# Patient Record
Sex: Male | Born: 1955 | Race: White | Hispanic: No | Marital: Single | Smoking: Never smoker
Health system: Southern US, Community
[De-identification: ages and names within clinical notes are randomized; demographics above are authoritative.]

## PROBLEM LIST (undated history)

## (undated) DIAGNOSIS — J309 Allergic rhinitis, unspecified: Secondary | ICD-10-CM

## (undated) DIAGNOSIS — K5790 Diverticulosis of intestine, part unspecified, without perforation or abscess without bleeding: Secondary | ICD-10-CM

## (undated) DIAGNOSIS — S0990XA Unspecified injury of head, initial encounter: Secondary | ICD-10-CM

## (undated) DIAGNOSIS — E039 Hypothyroidism, unspecified: Secondary | ICD-10-CM

## (undated) DIAGNOSIS — E119 Type 2 diabetes mellitus without complications: Secondary | ICD-10-CM

## (undated) DIAGNOSIS — F329 Major depressive disorder, single episode, unspecified: Secondary | ICD-10-CM

## (undated) DIAGNOSIS — F32A Depression, unspecified: Secondary | ICD-10-CM

## (undated) DIAGNOSIS — E291 Testicular hypofunction: Secondary | ICD-10-CM

## (undated) DIAGNOSIS — K648 Other hemorrhoids: Secondary | ICD-10-CM

## (undated) HISTORY — PX: ORIF FINGER FRACTURE: SHX2122

## (undated) HISTORY — PX: SHOULDER SURGERY: SHX246

## (undated) HISTORY — DX: Allergic rhinitis, unspecified: J30.9

## (undated) HISTORY — DX: Major depressive disorder, single episode, unspecified: F32.9

## (undated) HISTORY — PX: HERNIA REPAIR: SHX51

## (undated) HISTORY — DX: Type 2 diabetes mellitus without complications: E11.9

## (undated) HISTORY — PX: CARPAL TUNNEL RELEASE: SHX101

## (undated) HISTORY — DX: Hypothyroidism, unspecified: E03.9

## (undated) HISTORY — DX: Other hemorrhoids: K64.8

## (undated) HISTORY — DX: Depression, unspecified: F32.A

## (undated) HISTORY — DX: Unspecified injury of head, initial encounter: S09.90XA

## (undated) HISTORY — DX: Testicular hypofunction: E29.1

## (undated) HISTORY — DX: Diverticulosis of intestine, part unspecified, without perforation or abscess without bleeding: K57.90

---
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
## 1998-10-07 ENCOUNTER — Emergency Department (HOSPITAL_COMMUNITY): Admission: EM | Admit: 1998-10-07 | Discharge: 1998-10-07 | Payer: Self-pay | Admitting: Emergency Medicine

## 1998-10-07 ENCOUNTER — Encounter: Payer: Self-pay | Admitting: Emergency Medicine

## 1998-10-07 ENCOUNTER — Inpatient Hospital Stay (HOSPITAL_COMMUNITY): Admission: AD | Admit: 1998-10-07 | Discharge: 1998-10-08 | Payer: Self-pay | Admitting: *Deleted

## 1999-03-26 HISTORY — PX: INGUINAL HERNIA REPAIR: SHX194

## 1999-05-03 ENCOUNTER — Encounter (INDEPENDENT_AMBULATORY_CARE_PROVIDER_SITE_OTHER): Payer: Self-pay | Admitting: *Deleted

## 1999-05-03 ENCOUNTER — Other Ambulatory Visit: Admission: RE | Admit: 1999-05-03 | Discharge: 1999-05-03 | Payer: Self-pay | Admitting: Surgery

## 2003-02-15 ENCOUNTER — Emergency Department (HOSPITAL_COMMUNITY): Admission: EM | Admit: 2003-02-15 | Discharge: 2003-02-15 | Payer: Self-pay | Admitting: Emergency Medicine

## 2003-02-15 ENCOUNTER — Ambulatory Visit (HOSPITAL_BASED_OUTPATIENT_CLINIC_OR_DEPARTMENT_OTHER): Admission: RE | Admit: 2003-02-15 | Discharge: 2003-02-15 | Payer: Self-pay | Admitting: Orthopedic Surgery

## 2004-04-16 ENCOUNTER — Emergency Department (HOSPITAL_COMMUNITY): Admission: AD | Admit: 2004-04-16 | Discharge: 2004-04-16 | Payer: Self-pay | Admitting: Family Medicine

## 2005-03-30 ENCOUNTER — Emergency Department (HOSPITAL_COMMUNITY): Admission: EM | Admit: 2005-03-30 | Discharge: 2005-03-30 | Payer: Self-pay | Admitting: Family Medicine

## 2005-10-23 HISTORY — PX: KNEE SURGERY: SHX244

## 2006-05-24 DIAGNOSIS — S0990XA Unspecified injury of head, initial encounter: Secondary | ICD-10-CM

## 2006-05-24 HISTORY — DX: Unspecified injury of head, initial encounter: S09.90XA

## 2006-06-16 ENCOUNTER — Emergency Department (HOSPITAL_COMMUNITY): Admission: EM | Admit: 2006-06-16 | Discharge: 2006-06-16 | Payer: Self-pay | Admitting: *Deleted

## 2006-06-16 ENCOUNTER — Emergency Department (HOSPITAL_COMMUNITY): Admission: EM | Admit: 2006-06-16 | Discharge: 2006-06-16 | Payer: Self-pay | Admitting: Emergency Medicine

## 2006-06-19 ENCOUNTER — Encounter: Admission: RE | Admit: 2006-06-19 | Discharge: 2006-06-19 | Payer: Self-pay | Admitting: Specialist

## 2006-12-26 ENCOUNTER — Encounter: Admission: RE | Admit: 2006-12-26 | Discharge: 2006-12-26 | Payer: Self-pay | Admitting: Family Medicine

## 2007-02-15 ENCOUNTER — Emergency Department (HOSPITAL_COMMUNITY): Admission: EM | Admit: 2007-02-15 | Discharge: 2007-02-15 | Payer: Self-pay | Admitting: Family Medicine

## 2007-05-20 ENCOUNTER — Encounter: Admission: RE | Admit: 2007-05-20 | Discharge: 2007-05-20 | Payer: Self-pay | Admitting: Internal Medicine

## 2007-06-02 ENCOUNTER — Emergency Department (HOSPITAL_COMMUNITY): Admission: EM | Admit: 2007-06-02 | Discharge: 2007-06-02 | Payer: Self-pay | Admitting: Family Medicine

## 2007-09-23 DIAGNOSIS — K648 Other hemorrhoids: Secondary | ICD-10-CM

## 2007-09-23 DIAGNOSIS — K5790 Diverticulosis of intestine, part unspecified, without perforation or abscess without bleeding: Secondary | ICD-10-CM

## 2007-09-23 HISTORY — DX: Other hemorrhoids: K64.8

## 2007-09-23 HISTORY — DX: Diverticulosis of intestine, part unspecified, without perforation or abscess without bleeding: K57.90

## 2007-10-07 HISTORY — PX: COLONOSCOPY: SHX174

## 2009-03-03 ENCOUNTER — Encounter: Admission: RE | Admit: 2009-03-03 | Discharge: 2009-03-03 | Payer: Self-pay | Admitting: Family Medicine

## 2010-04-15 ENCOUNTER — Encounter: Payer: Self-pay | Admitting: Family Medicine

## 2010-08-10 NOTE — Op Note (Signed)
NAMEBRYN, Johnathan Estrada                          ACCOUNT NO.:  [id number]   MEDICAL RECORD NO.:  [id number]                   PATIENT TYPE:  AMB   LOCATION:  DSC                                  FACILITY:  [facility]   PHYSICIAN:  Cindee Salt, M.D.                    DATE OF BIRTH:  05/14/1955   DATE OF PROCEDURE:  02/15/2003  DATE OF DISCHARGE:                                 OPERATIVE REPORT   PREOPERATIVE DIAGNOSIS:  Open fracture, right middle finger, proximal  phalanx.   POSTOPERATIVE DIAGNOSES:  Open fracture, right middle finger, proximal  phalanx.   OPERATION:  Irrigation, reduction, and pinning of proximal phalanx fracture,  right middle finger.   SURGEON:  Cindee Salt, M.D.   ANESTHESIA:  General.   HISTORY:  The patient is a 55 year old male who suffered a crush injury to  his right middle finger.  He was seen at [facility], where the  wound was closed.  He was to call for an appointment.  He called and was  advised to proceed to come into Day Surgery for examination.   PROCEDURE:  The patient is brought to the operating room where a general  anesthetic was carried out without difficulty.  He was prepped using  Betadine scrubbing solution.  Right arm free.  Supine position.  The limb  was exsanguinated with an Esmarch bandage and tourniquet placed high on the  arm and was inflamed to 250 mmHg.  The wound was opened.  The extensor  tendon was found to be intact, although it did have areas of abrasions.  The  wound was copiously irrigated with saline and the skin was closed in  interrupted 5-0 nylon sutures.  The fracture was then manipulated, reduced,  and two 3-5 K-wires were then passed from the proximal aspect of the  proximal phalanx condyles into to the distal condyles.  X-rays on AP and  lateral direction revealed the fracture to be reduced in both AP and lateral  cross.  K-wires were used; these were bent and cut short.  Sterile  compressive dressing and  splint was applied.  The patient tolerated the  procedure well and was taken to the Recovery Room for observation in  satisfactory condition.  He is discharged home to return to the [facility] in one week.  He is discharged on Vicodin and Keflex.                                               Cindee Salt, M.D.    GK/MEDQ  D:  02/15/2003  T:  02/16/2003  Job:  [id number]

## 2010-09-19 ENCOUNTER — Ambulatory Visit (INDEPENDENT_AMBULATORY_CARE_PROVIDER_SITE_OTHER): Payer: Managed Care, Other (non HMO) | Admitting: Family Medicine

## 2010-09-19 ENCOUNTER — Encounter: Payer: Self-pay | Admitting: Family Medicine

## 2010-09-19 VITALS — BP 102/68 | HR 76 | Ht 70.0 in | Wt 188.0 lb

## 2010-09-19 DIAGNOSIS — F3289 Other specified depressive episodes: Secondary | ICD-10-CM

## 2010-09-19 DIAGNOSIS — R5383 Other fatigue: Secondary | ICD-10-CM

## 2010-09-19 DIAGNOSIS — F32A Depression, unspecified: Secondary | ICD-10-CM | POA: Insufficient documentation

## 2010-09-19 DIAGNOSIS — E7800 Pure hypercholesterolemia, unspecified: Secondary | ICD-10-CM

## 2010-09-19 DIAGNOSIS — F329 Major depressive disorder, single episode, unspecified: Secondary | ICD-10-CM

## 2010-09-19 DIAGNOSIS — Z125 Encounter for screening for malignant neoplasm of prostate: Secondary | ICD-10-CM

## 2010-09-19 DIAGNOSIS — R5381 Other malaise: Secondary | ICD-10-CM

## 2010-09-19 DIAGNOSIS — E039 Hypothyroidism, unspecified: Secondary | ICD-10-CM

## 2010-09-19 NOTE — Progress Notes (Signed)
Patient presents with complaint of fatigue, and feeling "like crap".  He works 6pm to [employer] through Sunday.  Takes Focalin which helps him stay awake.  Every Tuesday he feels very tired, describes a weird feeling (not a headache, but a discomfort, maybe like a pressure) at both his temples.  Has some congestion in his throat, some crusting in his nose, and wondering if his symptoms could be attributable to allergies.  Had allergy testing in the past with Dr. Corinda Gubler, and he has "all kinds of allergies".  Took allergy shots for a year or two, didn't notice much improvement.  Has tried store-brand of sudafed and/or Claritin D (not at same time), with some partial improvement.  Some sneezing, no itchy eyes, slight cough.  Denies fevers or sinus pressure.  Always feels terrible on Tuesday, and spends most of the day in bed.  Sleeps at least 7-8 hours on Monday (after returning from work), stays up for a few hours, then goes back to bed.  Has not been getting any exercise at all.  By Wednesday he is able to get some house and yardwork done.  Has been clenching his teeth a lot over the last few months, feels stressed (related to health issues of his mother and stepfather).  Has a dental appointment today.    Patient has a history of hypothyroidism.  He admits to missing thyroid med and symbyax once a week.  Doesn't recall having bloodwork done since I left the other office, which was almost a year ago, but did see the NP after I left.  He prefers to hold off on getting labs today.  He has a lab slip from Dr. Mariel Craft have with him.  Not fasting today.  Past Medical History  Diagnosis Date  . Depression   . Unspecified hypothyroidism     Past Surgical History  Procedure Date  . Shoulder surgery     bilateral  . Carpal tunnel release     right  . Orif finger fracture     R 3rd and 4th fingers  . Knee surgery     right, arthroscopic    History   Social History  . Marital Status: Single   Spouse Name: N/A    Number of Children: N/A  . Years of Education: N/A   Occupational History  . Not on file.   Social History Main Topics  . Smoking status: Never Smoker   . Smokeless tobacco: Never Used  . Alcohol Use: Yes     1-2 beers 3 or 4 times a year.  . Drug Use: No  . Sexually Active: Not on file   Other Topics Concern  . Not on file   Social History Narrative  . No narrative on file    Family History  Problem Relation Age of Onset  . Diabetes Mother   . Heart disease Mother     irregular heartbeat  . Diabetes Brother   . Cancer Maternal Uncle     colon cancer  . Stroke Maternal Grandmother     Current outpatient prescriptions:dexmethylphenidate (FOCALIN XR) 20 MG 24 hr capsule, Take 40 mg by mouth daily.  , Disp: , Rfl: ;  levothyroxine (SYNTHROID) 100 MCG tablet, Take 100 mcg by mouth daily.  , Disp: , Rfl: ;  olanzapine-FLUoxetine (SYMBYAX) 6-25 MG per capsule, Take 1 capsule by mouth every evening.  , Disp: , Rfl: ;  Multiple Vitamins-Minerals (ONE-A-DAY 50 PLUS PO), Take 1 tablet by mouth daily.  ,  Disp: , Rfl:   No Known Allergies  ROS:  See HPI.  +fatigue, allergies, depression.  Denies rash, GI complaints, cough, shortness of breath, chest pain.  PHYSICAL EXAM: BP 102/68  Pulse 76  Ht 5\' 10"  (1.778 m)  Wt 188 lb (85.276 kg)  BMI 26.98 kg/m2 Pleasant male, who is in no distress.  Doesn't appear particularly tired (not yawning). HEENT:  PERRL, EOMI, conjunctiva clear.  TM's and EAC's normal.  OP normal.  Mild edema of nasal mucosa.  Sinuses nontender.  Temporalis muscle and temporal arteries nontender. Neck:  No lymphadenopathy or thyromegaly Heart:  Regular rate and rhythm without murmurs Lungs: clear bilaterally Abdomen:  Soft, nontender, nondistended, without organomegaly or mass Extremities:  No clubbing, cyanosis or edema Skin: no rash Psych: flat affect, somewhat monotone speech.  Normal eye contact, hygiene and  grooming  ASSESSMENT/PLAN: 1. Fatigue   2. Unspecified hypothyroidism   3. Depressive disorder, not elsewhere classified    Fatigue can be multifactorial.  Likely has a huge component related to his shift work.  Need to r/o other etiologies, including inadequate replacement of his thyroid.  Consider sleep apnea, but look into investigating that only after other avenues (ie. Labs, changing habits, etc first).  Awaiting [facility] from [facility]. Daily exercise, and limiting sleep after finishing his night shifts.  He should also discuss this with his psychiatrist.  Bitemporal headaches--likely related to his teeth clenching, related to his stress.  Discuss with his dentist and psychiatrist.  May use NSAID's prn.  F/u here for labs--we can do Dr. Carie Caddy labs as well  Future labs ordered: CBC, C-met, TSH, PSA, FLP, Vitamin D-OH and testosterone  Schedule CPE

## 2010-09-19 NOTE — Patient Instructions (Signed)
Try and exercise daily.  Do not sleep more than 8 hours/day.  Follow up with Dr. Evelene Croon regarding your anxiety and teeth-clenching.  Return here for fasting labs and bring the lab slip from Dr. Evelene Croon

## 2010-09-20 ENCOUNTER — Other Ambulatory Visit: Payer: Managed Care, Other (non HMO)

## 2010-09-29 ENCOUNTER — Encounter: Payer: Self-pay | Admitting: Family Medicine

## 2010-11-22 ENCOUNTER — Encounter: Payer: Self-pay | Admitting: Family Medicine

## 2010-11-22 ENCOUNTER — Ambulatory Visit (INDEPENDENT_AMBULATORY_CARE_PROVIDER_SITE_OTHER): Payer: Managed Care, Other (non HMO) | Admitting: Family Medicine

## 2010-11-22 VITALS — BP 102/70 | HR 68 | Ht 70.0 in | Wt 190.0 lb

## 2010-11-22 DIAGNOSIS — Z23 Encounter for immunization: Secondary | ICD-10-CM

## 2010-11-22 DIAGNOSIS — Z0000 Encounter for general adult medical examination without abnormal findings: Secondary | ICD-10-CM

## 2010-11-22 LAB — POCT URINALYSIS DIPSTICK
Bilirubin, UA: NEGATIVE
Blood, UA: NEGATIVE
Glucose, UA: NEGATIVE
Leukocytes, UA: NEGATIVE
Nitrite, UA: NEGATIVE
Urobilinogen, UA: NEGATIVE

## 2010-11-22 MED ORDER — SYNTHROID 100 MCG PO TABS: 100.0000 ug | ORAL_TABLET | Freq: Every day | ORAL | Status: DC

## 2010-11-22 NOTE — Patient Instructions (Addendum)
HEALTH MAINTENANCE RECOMMENDATIONS:  It is recommended that you get at least 30 minutes of aerobic exercise at least 5 days/week (for weight loss, you may need as much as 60-90 minutes). This can be any activity that gets your heart rate up. This can be divided in 10-15 minute intervals if needed, but try and build up your endurance at least once a week.  Weight bearing exercise is also recommended twice weekly.  Eat a healthy diet with lots of vegetables, fruits and fiber.  "Colorful" foods have a lot of vitamins (ie green vegetables, tomatoes, red peppers, etc).  Limit sweet tea, regular sodas and alcoholic beverages, all of which has a lot of calories and sugar.  Up to 2 alcoholic drinks daily may be beneficial for women (unless trying to lose weight, watch sugars).  Drink a lot of water.  Sunscreen of at least SPF 30 should be used on all sun-exposed parts of the skin when outside between the hours of 10 am and 4 pm (not just when at beach or pool, but even with exercise, golf, tennis, and yard work!)  Use a sunscreen that says "broad spectrum" so it covers both UVA and UVB rays, and make sure to reapply every 1-2 hours.  Remember to change the batteries in your smoke detectors when changing your clock times in the spring and fall.  Use your seat belt every time you are in a car, and please drive safely and not be distracted with cell phones and texting while driving.  Please try and remember to take your medications daily--use a pill box if that will help.  You can keep it in the bathroom, and take your medications with a sip of water when you're awake to go to the bathroom during your "sleeping day" of Monday.  Please schedule your eye exam.  Consider trial of [place] if you continue to have any urinary symptoms as we discussed (weakened stream, etc.)

## 2010-11-22 NOTE — Progress Notes (Signed)
Johnathan Estrada is a 55 y.o. male who presents for a complete physical.  He has the following concerns:  Admits to missing his thyroid medication and Symbyax about once or twice weekly. Ongoing problems with fatigue, decreased libido, some anxiety--under the care of Dr. Evelene Croon.  Fasting labs were ordered at his last visit, but he never returned for labs, and isn't fasting today.  Immunization History  Administered Date(s) Administered  . Td 01/24/2003   Last colonoscopy: 7/09 Last PSA: over a year ago Dentist: every 6 months Ophtho: more than 2 years ago Exercise: none  Past Medical History  Diagnosis Date  . Depression     sees Dr. Evelene Croon  . Unspecified hypothyroidism   . Allergic rhinitis, cause unspecified     s/p immunotherapy  . Head injury 3/08    with facial fracture  . Diverticulosis 7/09  . Internal hemorrhoid 7/09    Past Surgical History  Procedure Date  . Shoulder surgery     bilateral (10/05 L; 8/07 R)  . Carpal tunnel release     right  . Orif finger fracture     R 3rd and 4th fingers  . Knee surgery 8/07    right, arthroscopic; Dr. Thurston Hole  . Inguinal hernia repair 2001    Dr. Ezzard Standing  . Colonoscopy 10/07/07    Dr. Bosie Clos; diverticulosis and small internal hemorrhoids; repeat 10 yrs    History   Social History  . Marital Status: Single    Spouse Name: N/A    Number of Children: N/A  . 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 Smoking status: Never Smoker   . Smokeless tobacco: Never Used  . Alcohol Use: Yes     1-2 beers per year.  . Drug Use: No  . Sexually Active: Not Currently   Other Topics Concern  . Not on file   Social History Narrative   Lives alone, has 2 dogs    Family History  Problem Relation Age of Onset  . Diabetes Mother   . Heart disease Mother     irregular heartbeat  . Diabetes Brother   . Cancer Maternal Uncle     colon cancer  . Stroke  Maternal Grandmother   . Heart disease Maternal Aunt   . Heart disease Maternal Uncle     Current outpatient prescriptions:dexmethylphenidate (FOCALIN XR) 20 MG 24 hr capsule, Take 40 mg by mouth daily.  , Disp: , Rfl: ;  olanzapine-FLUoxetine (SYMBYAX) 6-25 MG per capsule, Take 1 capsule by mouth every evening.  , Disp: , Rfl: ;  SYNTHROID 100 MCG tablet, Take 1 tablet (100 mcg total) by mouth daily., Disp: 30 tablet, Rfl: 0;  DISCONTD: levothyroxine (SYNTHROID) 100 MCG tablet, Take 100 mcg by mouth daily.  , Disp: , Rfl:  Multiple Vitamins-Minerals (ONE-A-DAY 50 PLUS PO), Take 1 tablet by mouth daily.  , Disp: , Rfl:   No Known Allergies  ROS: The patient denies anorexia, fever, weight changes, headaches,  vision loss (but noticed some change, needs new glasses), decreased hearing, ear pain, hoarseness, chest pain, palpitations, dizziness, syncope, dyspnea on exertion, cough, swelling, nausea, vomiting, diarrhea, constipation, abdominal pain, melena, hematochezia, indigestion/heartburn, hematuria, incontinence, nocturia, dysuria, numbness, tingling, weakness, tremor, suspicious skin lesions,  abnormal bleeding/bruising, or enlarged lymph nodes  +decreased libido, occasional knee pain with crouching, lack of energy.  Depression seems a little better, + anxiety, mainly at  work. Intermittently has weakened urinary stream, doesn't empty bladder well, usually just when he wakes up in the morning, fine the rest of the day  PHYSICAL EXAM: BP 102/70  Pulse 68  Ht 5\' 10"  (1.778 m)  Wt 190 lb (86.183 kg)  BMI 27.26 kg/m2  General Appearance:    Alert, cooperative, no distress, appears stated age  Head:    Normocephalic, without obvious abnormality, atraumatic  Eyes:    PERRL, conjunctiva/corneas clear, EOM's intact, fundi    benign  Ears:    Normal TM's and external ear canals  Nose:   Nares normal, mucosa normal, no drainage or sinus   tenderness  Throat:   Lips, mucosa, and tongue normal; teeth  and gums normal  Neck:   Supple, no lymphadenopathy;  thyroid:  no   enlargement/tenderness/nodules; no carotid   bruit or JVD  Back:    Spine nontender, no curvature, ROM normal, no CVA     tenderness  Lungs:     Clear to auscultation bilaterally without wheezes, rales or     ronchi; respirations unlabored  Chest Wall:    No tenderness or deformity   Heart:    Regular rate and rhythm, S1 and S2 normal, no murmur, rub   or gallop  Breast Exam:    No chest wall tenderness, masses or gynecomastia  Abdomen:     Soft, non-tender, nondistended, normoactive bowel sounds,    no masses, no hepatosplenomegaly  Genitalia:    Normal male external genitalia without lesions.  Testicles without masses.  No inguinal hernias.  Rectal:    Normal sphincter tone, no masses or tenderness; guaiac negative stool.  Prostate smooth, mildly enlarged, no nodules.  Extremities:   No clubbing, cyanosis or edema  Pulses:   2+ and symmetric all extremities  Skin:   Skin color, texture, turgor normal, no rashes or lesions  Lymph nodes:   Cervical, supraclavicular, and axillary nodes normal  Neurologic:   CNII-XII intact, normal strength, sensation and gait; reflexes 2+ and symmetric throughout          Psych:   Normal mood, hygiene and grooming, eye contact and speech. Flat affect    ASSESSMENT/PLAN: 1. Routine general medical examination at a health care facility  Visual acuity screening, POCT Urinalysis Dipstick  2. Need for Tdap vaccination  Tdap vaccine greater than or equal to 7yo IM   Discussed PSA screening (risks/benefits), recommended at least 30 minutes of aerobic activity at least 5 days/week; proper sunscreen use reviewed; healthy diet and alcohol recommendations (less than or equal to 2 drinks/day) reviewed; regular seatbelt use; changing batteries in smoke detectors. Self-testicular exams. Immunization recommendations discussed--given TdaP today.  He will get flu shot at work.  Colonoscopy recommendations  reviewed--UTD  Pt to schedule appt for fasting labs.  Will notify of results when available.  Expect TSH to be slightly high due to noncompliance.  Discussed ways he could remember to take his meds on Mondays.  If TSH is 5-6, will not adjust meds, and hope that he can be more compliant. If much higher, will adjust dose

## 2010-12-17 LAB — HEPATITIS PANEL, ACUTE
HCV Ab: NEGATIVE
Hep A IgM: NEGATIVE
Hep B C IgM: NEGATIVE
Hepatitis B Surface Ag: NEGATIVE

## 2010-12-17 LAB — HIV ANTIBODY (ROUTINE TESTING W REFLEX): HIV: NONREACTIVE

## 2011-03-26 DIAGNOSIS — E291 Testicular hypofunction: Secondary | ICD-10-CM

## 2011-03-26 HISTORY — DX: Testicular hypofunction: E29.1

## 2011-04-11 ENCOUNTER — Ambulatory Visit (INDEPENDENT_AMBULATORY_CARE_PROVIDER_SITE_OTHER): Payer: Managed Care, Other (non HMO) | Admitting: Family Medicine

## 2011-04-11 ENCOUNTER — Encounter: Payer: Self-pay | Admitting: Family Medicine

## 2011-04-11 VITALS — BP 120/82 | HR 72 | Temp 98.2°F | Ht 70.0 in | Wt 190.0 lb

## 2011-04-11 DIAGNOSIS — J069 Acute upper respiratory infection, unspecified: Secondary | ICD-10-CM

## 2011-04-12 NOTE — Progress Notes (Signed)
Patient was seen by PA student, gave less then a week h/o URI symptoms.  He was not evaluated by MD at all.  He received a phone call stating that his mother was short of breath and needed to go to ER while he was in exam room.  He was unable to stay for my eval, and was encouraged to reschedule.  Advised that ABX wouldn't be rx'd based on his duration of symptoms (and PA student's exam) at this point, but recommended to r/s if symptoms persisted/worsened

## 2011-06-06 ENCOUNTER — Encounter: Payer: Self-pay | Admitting: Family Medicine

## 2011-06-06 ENCOUNTER — Ambulatory Visit (INDEPENDENT_AMBULATORY_CARE_PROVIDER_SITE_OTHER): Payer: Managed Care, Other (non HMO) | Admitting: Family Medicine

## 2011-06-06 VITALS — BP 124/84 | HR 80 | Ht 70.0 in | Wt 188.0 lb

## 2011-06-06 DIAGNOSIS — F329 Major depressive disorder, single episode, unspecified: Secondary | ICD-10-CM

## 2011-06-06 DIAGNOSIS — R5381 Other malaise: Secondary | ICD-10-CM

## 2011-06-06 DIAGNOSIS — E039 Hypothyroidism, unspecified: Secondary | ICD-10-CM

## 2011-06-06 DIAGNOSIS — R350 Frequency of micturition: Secondary | ICD-10-CM

## 2011-06-06 DIAGNOSIS — R3915 Urgency of urination: Secondary | ICD-10-CM

## 2011-06-06 DIAGNOSIS — R6882 Decreased libido: Secondary | ICD-10-CM | POA: Insufficient documentation

## 2011-06-06 DIAGNOSIS — R5383 Other fatigue: Secondary | ICD-10-CM

## 2011-06-06 DIAGNOSIS — Z125 Encounter for screening for malignant neoplasm of prostate: Secondary | ICD-10-CM

## 2011-06-06 LAB — POCT URINALYSIS DIPSTICK
Bilirubin, UA: NEGATIVE
Ketones, UA: NEGATIVE
Spec Grav, UA: 1.015

## 2011-06-06 NOTE — Patient Instructions (Signed)
Urinary frequency and urgency--your urine was sent for culture.  We will treat with antibiotics if it shows infection.  If no infection, then trial of behavioral technique--ie cut back on caffeine, void frequently (every 1-1.5 hrs).  If symptoms continue, then trial of meds such as ditropan or detrol.

## 2011-06-06 NOTE — Progress Notes (Signed)
Chief complaint: having urinary urgency and frequency x 6 month. Also very stressed out with his job.    HPI:  He has been out of Synthroid for about 2-3 months because he ran out, didn't get his blood work.  Denies any skin/hair changes, constipation.  Lack of energy has been constant. Ongoing lack of libido.  Having some urinary frequency and urgency.  Has a cup of coffee daily, and 1 caffeinated soda daily, plus 1-2 glasses of tea when home/not working.  Notices the frequency more often at work than at home.  Has had an episode of incontinence where he didn't get to the bathroom quickly enough.  Has a weak stream when he wakes up in the morning, but stream is stronger other times of the day.  "my job is stressing me to the hilt".  Feels like he's going to explode, is grinding his teeth.  He feels like he needs time away to destress. Hasn't been exercising.  He sleeps most of the time when not working.  He used to enjoy going to the gym.  Sees Dr. Evelene Croon every 3 months for depression.  Has been compliant with taking his Symbyax.  Takes Focalin XR when he works.  Labs were ordered for him to be done in June (CBC, c-met, Vitamin D, PSA, TSH, lipids, testosterone)--he never came to have them done (because he is worried about the cholesterol being high, per pt)  Past Medical History  Diagnosis Date  . Depression     sees Dr. Evelene Croon  . Unspecified hypothyroidism   . Allergic rhinitis, cause unspecified     s/p immunotherapy  . Head injury 3/08    with facial fracture  . Diverticulosis 7/09  . Internal hemorrhoid 7/09    Past Surgical History  Procedure Date  . Shoulder surgery     bilateral (10/05 L; 8/07 R)  . Carpal tunnel release     right  . Orif finger fracture     R 3rd and 4th fingers  . Knee surgery 8/07    right, arthroscopic; Dr. Thurston Hole  . Inguinal hernia repair 2001    Dr. Ezzard Standing  . Colonoscopy 10/07/07    Dr. Bosie Clos; diverticulosis and small internal hemorrhoids; repeat 10  yrs    History   Social History  . Marital Status: Single    Spouse Name: N/A    Number of Children: N/A  . 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 Smoking status: Never Smoker   . Smokeless tobacco: Never Used  . Alcohol Use: Yes     1-2 beers per year.  . Drug Use: No  . Sexually Active: Not Currently   Other Topics Concern  . Not on file   Social History Narrative   Lives alone, has 2 dogs    Family History  Problem Relation Age of Onset  . Diabetes Mother   . Heart disease Mother     irregular heartbeat  . Diabetes Brother   . Cancer Maternal Uncle     colon cancer  . Stroke Maternal Grandmother   . Heart disease Maternal Aunt   . Heart disease Maternal Uncle     Current outpatient prescriptions:dexmethylphenidate (FOCALIN XR) 20 MG 24 hr capsule, Take 40 mg by mouth daily.  , Disp: , Rfl: ;  olanzapine-FLUoxetine (SYMBYAX) 6-25 MG per capsule, Take 1 capsule by mouth every evening.  , Disp: ,  Rfl: ;  Multiple Vitamins-Minerals (ONE-A-DAY 50 PLUS PO), Take 1 tablet by mouth daily.  , Disp: , Rfl: ;  SYNTHROID 100 MCG tablet, Take 1 tablet (100 mcg total) by mouth daily., Disp: 30 tablet, Rfl: 0  No Known Allergies  ROS:  Denies fevers,  URI symptoms, dizziness, chest pain, nausea, vomiting, diarrhea, abdominal pain, skin rash.  See HPI for further details/ROS  PHYSICAL EXAM: BP 124/84  Pulse 80  Ht 5\' 10"  (1.778 m)  Wt 188 lb (85.276 kg)  BMI 26.98 kg/m2 Well developed, well appearing male, in no distress Neck: no lymphadenopathy or thyromegaly Heart: regular rate and rhythm without murmur Lungs: clear bilaterally Abdomen: soft, nontender, nondistended, no organomegaly or mass Extremities: no edema Skin: no rash Psych: flat affect.  Normal speech, eye contact, hygiene and grooming   ASSESSMENT/PLAN: 1. Urinary frequency  POCT Urinalysis Dipstick, Urine culture  2. Urinary  urgency  POCT Urinalysis Dipstick  3. Fatigue  CBC with Differential, Comprehensive metabolic panel, Vitamin D 25 hydroxy, Testosterone  4. Special screening for malignant neoplasm of prostate  PSA  5. Unspecified hypothyroidism  TSH  6. Depressive disorder, not elsewhere classified    7. Decreased libido      Urinary frequency and urgency.  Rule out UTI--send urine for culture.  If negative, trial of behavioral technique--ie cut back on caffeine, void frequently (every 1-1.5 hrs).  If symptoms continue, then trial of meds such as ditropan or detrol.  Do all labs today except lipids--if thyroid is off (which it will be, off meds), then wait for lipids until thyroid is adequately replaced.  Discussed the importance of compliance with recheck of thyroid after being on meds.  Discussed briefly stress reduction techniques.  Advised that he needs to follow up with Dr. Evelene Croon regarding his stress, and it will be up to her whether or not she feels any time off is recommended (I will not write him out for work).  I encouraged that he start exercising regularly, stress reduction techniques.  Time off could actually be worse--work accumulates and is more stressful upon return, and he isn't learning a way to effectively deal with stress. Discussed reasons for why/when people are put out of work--usually related to severe depression where meds need to be started, but this isn't the case for him.  He needs to discuss with Dr. Evelene Croon if he wants to pursue this, but I explained my recommendation to NOT take time off from work.  Expect to f/u in 6-8 weeks for repeat TSH; await lab results. Will need lipids in future when thyroid adequately replaced

## 2011-06-07 ENCOUNTER — Encounter: Payer: Self-pay | Admitting: Family Medicine

## 2011-06-07 DIAGNOSIS — E291 Testicular hypofunction: Secondary | ICD-10-CM | POA: Insufficient documentation

## 2011-06-07 DIAGNOSIS — E559 Vitamin D deficiency, unspecified: Secondary | ICD-10-CM | POA: Insufficient documentation

## 2011-06-07 LAB — COMPREHENSIVE METABOLIC PANEL
AST: 19 U/L (ref 0–37)
Albumin: 4.4 g/dL (ref 3.5–5.2)
BUN: 13 mg/dL (ref 6–23)
CO2: 26 mEq/L (ref 19–32)
Calcium: 9.4 mg/dL (ref 8.4–10.5)
Chloride: 102 mEq/L (ref 96–112)
Glucose, Bld: 93 mg/dL (ref 70–99)
Potassium: 4.6 mEq/L (ref 3.5–5.3)

## 2011-06-07 LAB — VITAMIN D 25 HYDROXY (VIT D DEFICIENCY, FRACTURES): Vit D, 25-Hydroxy: 22 ng/mL — ABNORMAL LOW (ref 30–89)

## 2011-06-07 LAB — CBC WITH DIFFERENTIAL/PLATELET
HCT: 43.9 % (ref 39.0–52.0)
Hemoglobin: 15 g/dL (ref 13.0–17.0)
Lymphocytes Relative: 15 % (ref 12–46)
Lymphs Abs: 1.1 10*3/uL (ref 0.7–4.0)
MCV: 87.8 fL (ref 78.0–100.0)
Monocytes Absolute: 0.6 10*3/uL (ref 0.1–1.0)
Monocytes Relative: 8 % (ref 3–12)
Neutro Abs: 5.4 10*3/uL (ref 1.7–7.7)
WBC: 7.2 10*3/uL (ref 4.0–10.5)

## 2011-06-07 LAB — PSA: PSA: 1.32 ng/mL (ref <=4.00)

## 2011-06-07 LAB — TSH: TSH: 11.087 u[IU]/mL — ABNORMAL HIGH (ref 0.350–4.500)

## 2011-06-13 ENCOUNTER — Encounter: Payer: Self-pay | Admitting: *Deleted

## 2011-06-20 ENCOUNTER — Other Ambulatory Visit: Payer: Self-pay | Admitting: *Deleted

## 2011-06-20 DIAGNOSIS — E039 Hypothyroidism, unspecified: Secondary | ICD-10-CM

## 2011-06-20 DIAGNOSIS — R6882 Decreased libido: Secondary | ICD-10-CM

## 2011-06-20 DIAGNOSIS — R5383 Other fatigue: Secondary | ICD-10-CM

## 2011-06-20 DIAGNOSIS — E291 Testicular hypofunction: Secondary | ICD-10-CM

## 2011-06-20 MED ORDER — TESTOSTERONE 20.25 MG/1.25GM (1.62%) TD GEL: 2.0000 | Freq: Every day | TRANSDERMAL | Status: DC

## 2011-06-20 MED ORDER — SYNTHROID 100 MCG PO TABS: 100.0000 ug | ORAL_TABLET | Freq: Every day | ORAL | Status: DC

## 2011-08-22 ENCOUNTER — Encounter: Payer: Self-pay | Admitting: Family Medicine

## 2011-08-29 ENCOUNTER — Telehealth: Payer: Self-pay | Admitting: Family Medicine

## 2011-08-29 DIAGNOSIS — E559 Vitamin D deficiency, unspecified: Secondary | ICD-10-CM

## 2011-08-29 MED ORDER — ERGOCALCIFEROL 1.25 MG (50000 UT) PO CAPS: 50000.0000 [IU] | ORAL_CAPSULE | ORAL | Status: DC

## 2011-08-29 NOTE — Telephone Encounter (Signed)
Yes, he does need Vitamin d rx.  He was told to make appt to f/u on his labs.  Looks like his appt isn't until July (?) so will go ahead and rx the vitamin D for him so he doesn't need to wait until July to start replacement.  Still should keep appt to go over labs at that time.  Rx sent

## 2011-08-29 NOTE — Telephone Encounter (Signed)
Called pt.advised of dr knapp's instructions. Pt was driving so he will call back to make another sooner appointment to go over labs

## 2011-09-23 ENCOUNTER — Other Ambulatory Visit: Payer: Managed Care, Other (non HMO)

## 2011-10-07 ENCOUNTER — Telehealth: Payer: Self-pay | Admitting: *Deleted

## 2011-10-07 NOTE — Telephone Encounter (Signed)
Error

## 2011-10-11 ENCOUNTER — Other Ambulatory Visit: Payer: Self-pay | Admitting: Family Medicine

## 2012-03-08 ENCOUNTER — Other Ambulatory Visit: Payer: Self-pay | Admitting: Family Medicine

## 2012-03-09 NOTE — Telephone Encounter (Signed)
Patient must schedule appt for any further refills.

## 2012-04-30 ENCOUNTER — Other Ambulatory Visit: Payer: Self-pay | Admitting: Family Medicine

## 2012-05-14 ENCOUNTER — Other Ambulatory Visit: Payer: Self-pay | Admitting: Family Medicine

## 2012-05-24 ENCOUNTER — Other Ambulatory Visit: Payer: Self-pay | Admitting: Family Medicine

## 2012-07-08 ENCOUNTER — Other Ambulatory Visit: Payer: Managed Care, Other (non HMO)

## 2012-07-08 ENCOUNTER — Other Ambulatory Visit: Payer: Self-pay | Admitting: Family Medicine

## 2012-07-08 DIAGNOSIS — E039 Hypothyroidism, unspecified: Secondary | ICD-10-CM

## 2012-07-08 DIAGNOSIS — E559 Vitamin D deficiency, unspecified: Secondary | ICD-10-CM

## 2012-07-08 DIAGNOSIS — R5381 Other malaise: Secondary | ICD-10-CM

## 2012-07-08 DIAGNOSIS — E7800 Pure hypercholesterolemia, unspecified: Secondary | ICD-10-CM

## 2012-07-08 DIAGNOSIS — R5383 Other fatigue: Secondary | ICD-10-CM

## 2012-07-08 DIAGNOSIS — E291 Testicular hypofunction: Secondary | ICD-10-CM

## 2012-07-08 LAB — LIPID PANEL
Cholesterol: 204 mg/dL — ABNORMAL HIGH (ref 0–200)
LDL Cholesterol: 131 mg/dL — ABNORMAL HIGH (ref 0–99)
Total CHOL/HDL Ratio: 6.4 Ratio
VLDL: 41 mg/dL — ABNORMAL HIGH (ref 0–40)

## 2012-07-08 LAB — CBC WITH DIFFERENTIAL/PLATELET
Basophils Absolute: 0 10*3/uL (ref 0.0–0.1)
Basophils Relative: 0 % (ref 0–1)
Eosinophils Absolute: 0.1 10*3/uL (ref 0.0–0.7)
Eosinophils Relative: 2 % (ref 0–5)
HCT: 43.3 % (ref 39.0–52.0)
Hemoglobin: 15.3 g/dL (ref 13.0–17.0)
MCH: 30.5 pg (ref 26.0–34.0)
MCHC: 35.3 g/dL (ref 30.0–36.0)
MCV: 86.3 fL (ref 78.0–100.0)
Monocytes Absolute: 0.6 10*3/uL (ref 0.1–1.0)
Monocytes Relative: 8 % (ref 3–12)
RDW: 14.6 % (ref 11.5–15.5)

## 2012-07-09 LAB — VITAMIN D 25 HYDROXY (VIT D DEFICIENCY, FRACTURES): Vit D, 25-Hydroxy: 25 ng/mL — ABNORMAL LOW (ref 30–89)

## 2012-07-13 ENCOUNTER — Ambulatory Visit (INDEPENDENT_AMBULATORY_CARE_PROVIDER_SITE_OTHER): Payer: Managed Care, Other (non HMO) | Admitting: Family Medicine

## 2012-07-13 ENCOUNTER — Encounter: Payer: Self-pay | Admitting: Family Medicine

## 2012-07-13 VITALS — BP 102/64 | HR 76 | Ht 70.0 in | Wt 200.0 lb

## 2012-07-13 DIAGNOSIS — R5383 Other fatigue: Secondary | ICD-10-CM

## 2012-07-13 DIAGNOSIS — E039 Hypothyroidism, unspecified: Secondary | ICD-10-CM

## 2012-07-13 DIAGNOSIS — Z9114 Patient's other noncompliance with medication regimen: Secondary | ICD-10-CM

## 2012-07-13 DIAGNOSIS — Z9119 Patient's noncompliance with other medical treatment and regimen: Secondary | ICD-10-CM

## 2012-07-13 DIAGNOSIS — Z79899 Other long term (current) drug therapy: Secondary | ICD-10-CM

## 2012-07-13 DIAGNOSIS — R6882 Decreased libido: Secondary | ICD-10-CM

## 2012-07-13 DIAGNOSIS — E291 Testicular hypofunction: Secondary | ICD-10-CM

## 2012-07-13 DIAGNOSIS — E559 Vitamin D deficiency, unspecified: Secondary | ICD-10-CM

## 2012-07-13 DIAGNOSIS — Z125 Encounter for screening for malignant neoplasm of prostate: Secondary | ICD-10-CM

## 2012-07-13 DIAGNOSIS — R5381 Other malaise: Secondary | ICD-10-CM

## 2012-07-13 MED ORDER — ERGOCALCIFEROL 1.25 MG (50000 UT) PO CAPS: 50000.0000 [IU] | ORAL_CAPSULE | ORAL | Status: DC

## 2012-07-13 MED ORDER — SYNTHROID 100 MCG PO TABS: ORAL_TABLET | ORAL | Status: DC

## 2012-07-13 MED ORDER — TESTOSTERONE 20.25 MG/1.25GM (1.62%) TD GEL: 2.0000 | Freq: Every day | TRANSDERMAL | Status: DC

## 2012-07-13 NOTE — Patient Instructions (Addendum)
Try and get at least 30 minutes of aerobic exercise daily, weights twice a week. Ask your mother about sleep apnea (pauses in your breathing while sleeping). Restart all of your medications.  After completing prescription vitamin D, start taking over-the-counter 1000 IU every day.  Return in 2 months for labs and f/u visit after

## 2012-07-13 NOTE — Progress Notes (Signed)
Chief Complaint  Patient presents with  . Med check    nonfasting med check, labs already done.   "I feel like hell". "All I want to do is sleep all the time".  He stopped taking the Androgel due to fear of side effects. He only used one bottle of it, still has a bottle at home (unsure if expired). He recently took some Vitamin D rx that he still had at home (leftover, so never completed original prescription).  Took some, but has been out for a couple of weeks. He has been out of his thyroid medication for at least 2 months.  He doesn't feel like he ever felt good, or that energy was improved, even when taking thyroid, testosterone replacement and vitamin D. He thinks he has been told that he snores.  Unsure about any apnea--recently moved back in with his mother, so will check with her.  Wakes up feeling unrefreshed.  He felt somewhat better when he was lifting weights. +weight gain, pants are all tight.  He sees Dr. Evelene Croon for depression, and feels like that is somewhat better.  Past Medical History  Diagnosis Date  . Depression     sees Dr. Evelene Croon  . Unspecified hypothyroidism   . Allergic rhinitis, cause unspecified     s/p immunotherapy  . Head injury 3/08    with facial fracture  . Diverticulosis 7/09  . Internal hemorrhoid 7/09  . Male hypogonadism 09-01-11    low testosterone   Past Surgical History  Procedure Laterality Date  . Shoulder surgery      bilateral (10/05 L; 8/07 R)  . Carpal tunnel release      right  . Orif finger fracture      R 3rd and 4th fingers  . Knee surgery  8/07    right, arthroscopic; Dr. Thurston Hole  . Inguinal hernia repair  01-Sep-1999    Dr. Ezzard Standing  . Colonoscopy  10/07/07    Dr. Bosie Clos; diverticulosis and small internal hemorrhoids; repeat 10 yrs   History   Social History  . Marital Status: Single    Spouse Name: N/A    Number of Children: N/A  . Years of Education: N/A   Occupational History  . Curator for [employer]    Social  History Main Topics  . Smoking status: Never Smoker   . Smokeless tobacco: Never Used  . Alcohol Use: Yes     Comment: 1-2 beers per year.  . Drug Use: No  . Sexually Active: Not Currently   Other Topics Concern  . Not on file   Social History Narrative   Moved in with his mother when his father got sick.  He passed away in September 01, 2011.  Mother's health has deteriorated.  2 dogs   Family History  Problem Relation Age of Onset  . Diabetes Mother   . Heart disease Mother     irregular heartbeat  . Diabetes Brother   . Cancer Maternal Uncle     colon cancer  . Stroke Maternal Grandmother   . Heart disease Maternal Aunt   . Heart disease Maternal Uncle    Current Outpatient Prescriptions on File Prior to Visit  Medication Sig Dispense Refill  . dexmethylphenidate (FOCALIN XR) 20 MG 24 hr capsule Take 40 mg by mouth daily.        Marland Kitchen olanzapine-FLUoxetine (SYMBYAX) 6-25 MG per capsule Take 1 capsule by mouth every evening.        . Multiple Vitamins-Minerals (ONE-A-DAY 50  PLUS PO) Take 1 tablet by mouth daily.         No current facility-administered medications on file prior to visit.   No Known Allergies  ROS:  +weight gain, fatigue. Denies headaches, fevers.  +sneezing/allergies.  Denies cough, shortness of breath, chest pain, nausea, vomiting, bowel changes.  Denies skin/hair changes.  Depression is somewhat improved with medication.  Continues to have decreased libido. See HPI  PHYSICAL EXAM: BP 102/64  Pulse 76  Ht 5\' 10"  (1.778 m)  Wt 200 lb (90.719 kg)  BMI 28.7 kg/m2 Well developed, pleasant, mildly overweight male in no distress HEENT:  PERRL conjunctiva clear.  OP clear Neck: no lymphadenopathy, thyromegaly or mass Heart: regular rate and rhythm without murmur Lungs: clear bilaterally Abdomen: soft, nontender, no mass Extremities: no edema Psych: Flat affect.  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  Cranial nerves  grossly intact, normal gait  ASSESSMENT/PLAN:  Decreased libido - Plan: Testosterone (ANDROGEL) 20.25 MG/1.25GM (1.62%) GEL  Unspecified hypothyroidism - Plan: SYNTHROID 100 MCG tablet  Fatigue  Vitamin D deficiency  Hypogonadism male  Unspecified vitamin D deficiency - Plan: ergocalciferol (VITAMIN D2) 50000 UNITS capsule  Noncompliance with medication regimen  Reviewed risks of noncompliance (risks of untreated hypothyroidism, testosterone deficiency, vitamin D deficiency). Differential diagnosis of fatigue reviewed, clearly with contributing factors being hypothyroidism, hypogonadism and possibly vitamin D deficency.  Allergies may contribute, and there is concern for sleep apnea.  He will speak with his mother about it, and resume his medications.  Will try and get regular exercise, and if he still has fatigue and unrefreshed sleep, then will need a sleep study.  Add prolactin level to labs drawn last week (not sure if ever done as part of hypogonadism work-up)

## 2012-07-14 DIAGNOSIS — Z9114 Patient's other noncompliance with medication regimen: Secondary | ICD-10-CM | POA: Insufficient documentation

## 2012-07-14 DIAGNOSIS — Z91148 Patient's other noncompliance with medication regimen for other reason: Secondary | ICD-10-CM | POA: Insufficient documentation

## 2012-07-14 LAB — PROLACTIN: Prolactin: 13.3 ng/mL (ref 2.1–17.1)

## 2012-09-21 ENCOUNTER — Other Ambulatory Visit: Payer: Managed Care, Other (non HMO)

## 2012-09-22 ENCOUNTER — Telehealth: Payer: Self-pay | Admitting: Family Medicine

## 2012-09-22 NOTE — Telephone Encounter (Signed)
There are risks and benefits of testosterone replacement, that we have reviewed in past, and I'm happy to review with him again at his visit.  He is symptomatic from his low testosterone, so likely would have benefit.  Although his symptom is fatigue, which can also be contributed by his noncompliance with his thyroid medication also.  If he wants to feel better, he needs to take his meds as directed.  Please reschedule his labs and visit.  Can be 6 weeks from now (or TSH won't be accurate).  Visit needs to be 30 min (45 if scheduling for CPE/med check)

## 2012-09-22 NOTE — Telephone Encounter (Signed)
PT JUST REALIZED MISSED LAB APPT YESTERDAY SO WANTS TO CANCEL 11:30 APPT WITH DR KNAPP FOR TOMORROW SINCE LABS WERE NOT DONE. ALSO PT STATES HAVE NOT BEEN TAKING SYNTHROID MED ON REGULAR BASIS AND STOPPED TAKING ANDROGEL DUE TO NEGATIVE THINGS HE WAS  HEARING ON TV ABOUT ANDROGEL. WANTS TO KNOW DR KNAPPS OPININON ON IF ANDROGEL IS STILL SAFE TO TAKE. PT NOT TAKING SYNTHROID REGULARLY DUE TO WORK SCHEDULE BUT WILL START THEN COME IN FOR APPT IF THAT IS WHAT DR KNAPP WANTS. PT ALSO AWARE THAT HE IS DUE FOR CPE.

## 2012-09-23 ENCOUNTER — Ambulatory Visit: Payer: Managed Care, Other (non HMO) | Admitting: Family Medicine

## 2012-09-24 NOTE — Telephone Encounter (Signed)
Left 2 messages with pt detailing Dr Delford Field instructions and asking pt to call to reschedule appt in 6 weeks

## 2012-10-11 ENCOUNTER — Other Ambulatory Visit: Payer: Self-pay | Admitting: Family Medicine

## 2012-11-07 ENCOUNTER — Other Ambulatory Visit: Payer: Self-pay | Admitting: Family Medicine

## 2012-11-09 ENCOUNTER — Ambulatory Visit (INDEPENDENT_AMBULATORY_CARE_PROVIDER_SITE_OTHER): Payer: Managed Care, Other (non HMO) | Admitting: Family Medicine

## 2012-11-09 ENCOUNTER — Encounter: Payer: Self-pay | Admitting: Family Medicine

## 2012-11-09 VITALS — BP 116/66 | HR 63 | Wt 190.0 lb

## 2012-11-09 DIAGNOSIS — K649 Unspecified hemorrhoids: Secondary | ICD-10-CM

## 2012-11-09 MED ORDER — HYDROCORTISONE 2.5 % RE CREA: TOPICAL_CREAM | Freq: Two times a day (BID) | RECTAL | Status: DC

## 2012-11-09 NOTE — Progress Notes (Signed)
  Subjective:    Patient ID: Johnathan Estrada, male    DOB: 1955-06-21, 57 y.o.   MRN: [id number]  HPI About 6 days ago after having a BM he noted some discomfort and also noted some swelling in the rectal area. He has a previous history of rectal fissures. He still feels a lump in his rectal area.   Review of Systems     Objective:   Physical Exam Exam of the anal area does show a hemorrhoid present at the 3:00 position. Just inside the canal there is question of slight thrombosis. He was tender to palpation.       Assessment & Plan:  Hemorrhoid - Plan: hydrocortisone (ANUSOL-HC) 2.5 % rectal cream  recommend sitz baths to help soothe it and ensure softer BMs. If his symptoms get worse, he is to let he know for possible referral. The thrombosed area would've been very difficult to handle here in the office and I therefore elected to be conservative

## 2012-11-09 NOTE — Patient Instructions (Signed)
Use warm water on the anal area 2 or 3 times per day. Ensure that you have softer bowel movements so you don't have to strain. She cried out over the next week or so if it does not or gets worse call me

## 2012-11-11 ENCOUNTER — Ambulatory Visit: Payer: Managed Care, Other (non HMO) | Admitting: Family Medicine

## 2012-12-06 ENCOUNTER — Other Ambulatory Visit: Payer: Self-pay | Admitting: Family Medicine

## 2012-12-21 ENCOUNTER — Other Ambulatory Visit: Payer: Self-pay | Admitting: Family Medicine

## 2012-12-21 NOTE — Telephone Encounter (Signed)
Refilled Synthroid #30 only left message asking patient to please call back and schedule appointment for labs and med check ASAP.

## 2012-12-21 NOTE — Telephone Encounter (Signed)
Refill #30 only.  Lab orders are in system--were due back in June.  Needs labs and med check

## 2012-12-21 NOTE — Telephone Encounter (Signed)
This was sent to me.

## 2013-01-25 ENCOUNTER — Other Ambulatory Visit: Payer: Managed Care, Other (non HMO)

## 2013-01-25 ENCOUNTER — Other Ambulatory Visit: Payer: Self-pay | Admitting: Family Medicine

## 2013-01-25 DIAGNOSIS — Z79899 Other long term (current) drug therapy: Secondary | ICD-10-CM

## 2013-01-25 DIAGNOSIS — E039 Hypothyroidism, unspecified: Secondary | ICD-10-CM

## 2013-01-25 DIAGNOSIS — Z125 Encounter for screening for malignant neoplasm of prostate: Secondary | ICD-10-CM

## 2013-01-25 DIAGNOSIS — E291 Testicular hypofunction: Secondary | ICD-10-CM

## 2013-01-25 LAB — CBC WITH DIFFERENTIAL/PLATELET
Basophils Absolute: 0 10*3/uL (ref 0.0–0.1)
Basophils Relative: 1 % (ref 0–1)
Eosinophils Relative: 4 % (ref 0–5)
HCT: 42.3 % (ref 39.0–52.0)
MCHC: 35.5 g/dL (ref 30.0–36.0)
MCV: 86.7 fL (ref 78.0–100.0)
Monocytes Absolute: 0.5 10*3/uL (ref 0.1–1.0)
RDW: 14.9 % (ref 11.5–15.5)

## 2013-01-25 LAB — COMPREHENSIVE METABOLIC PANEL
ALT: 13 U/L (ref 0–53)
AST: 18 U/L (ref 0–37)
Albumin: 4.2 g/dL (ref 3.5–5.2)
CO2: 24 mEq/L (ref 19–32)
Calcium: 9.2 mg/dL (ref 8.4–10.5)
Chloride: 104 mEq/L (ref 96–112)
Potassium: 4 mEq/L (ref 3.5–5.3)
Sodium: 137 mEq/L (ref 135–145)
Total Protein: 7.3 g/dL (ref 6.0–8.3)

## 2013-01-25 LAB — TSH: TSH: 10.758 u[IU]/mL — ABNORMAL HIGH (ref 0.350–4.500)

## 2013-01-25 NOTE — Telephone Encounter (Signed)
This was in the refills, he was in this am for TSH-should I just refuse and after results call in?

## 2013-01-25 NOTE — Telephone Encounter (Signed)
Yes.  Need to wait on lab results.  Also, he has no OV scheduled, just these labs.  If any labs abnl (which I'm sure they will be), he will need to have OV to discuss.  He canceled at least 2 visits

## 2013-01-26 LAB — PSA: PSA: 1.36 ng/mL (ref <=4.00)

## 2013-02-04 ENCOUNTER — Telehealth: Payer: Self-pay | Admitting: Family Medicine

## 2013-02-04 ENCOUNTER — Other Ambulatory Visit: Payer: Self-pay | Admitting: *Deleted

## 2013-02-04 ENCOUNTER — Telehealth: Payer: Self-pay | Admitting: *Deleted

## 2013-02-04 MED ORDER — SYNTHROID 100 MCG PO TABS: ORAL_TABLET | ORAL | Status: DC

## 2013-02-04 NOTE — Telephone Encounter (Signed)
His labs were done 11/3--it has taken him this long to return our phone call to even schedule a visit, so I'm fine with him waiting.  His dose of synthroid was either incorrect, or he hadn't been taking it correctly.  I'm fine with refilling his previous dose for him to continue taking that until we discuss it at his visit.

## 2013-02-04 NOTE — Telephone Encounter (Signed)
Patient advised and sent refill on current dose of synthroid to [facility].

## 2013-02-05 NOTE — Telephone Encounter (Signed)
tsd  °

## 2013-02-17 ENCOUNTER — Encounter: Payer: Self-pay | Admitting: Family Medicine

## 2013-02-17 ENCOUNTER — Ambulatory Visit (INDEPENDENT_AMBULATORY_CARE_PROVIDER_SITE_OTHER): Payer: Managed Care, Other (non HMO) | Admitting: Family Medicine

## 2013-02-17 VITALS — BP 120/84 | HR 72 | Ht 70.0 in | Wt 194.0 lb

## 2013-02-17 DIAGNOSIS — Z23 Encounter for immunization: Secondary | ICD-10-CM

## 2013-02-17 DIAGNOSIS — E559 Vitamin D deficiency, unspecified: Secondary | ICD-10-CM

## 2013-02-17 DIAGNOSIS — E291 Testicular hypofunction: Secondary | ICD-10-CM

## 2013-02-17 DIAGNOSIS — E039 Hypothyroidism, unspecified: Secondary | ICD-10-CM

## 2013-02-17 MED ORDER — LEVOTHYROXINE SODIUM 125 MCG PO TABS: 125.0000 ug | ORAL_TABLET | Freq: Every day | ORAL | Status: DC

## 2013-02-17 NOTE — Patient Instructions (Addendum)
Take your Synthroid once daily, on an empty stomach, separate from any vitamins, medications and food.  Wait an hour before eating. We increased your dose to 125 mcg.  Recheck labs in 6-8 weeks to see if this dose is correct.  Stop taking the prescription vitamin D.  Instead, get an over-the-counter 1000 IU of Vitamin D3 to take every day.  Continue to take the multivitamin daily (just remember to take these separately from your thyroid medication).    Let us know if you reconsider taking the testosterone replacement.  If/when you decide to restart, it is important to follow up in 3 months for bloodwork.  Even if you feel like it isn't working or notice any benefit, still continue it for three months and get labs checked, because it might not feel like it is working because the dose needs to be raised.    It is recommended that you get at least 30 minutes of aerobic exercise at least 5 days/week (for weight loss, you may need as much as 60-90 minutes). This can be any activity that gets your heart rate up. This can be divided in 10-15 minute intervals if needed, but try and build up your endurance at least once a week.  Weight bearing exercise is also recommended twice weekly.  Use antibacterial ointment to lesion on scalp.  See if scab heals up, and goes back to looking like the same flat mole that has been present for years. If it does not heal, or if it remains raised, or irregularly colored, then needs to be biopsied.    I would schedule appointment now with a dermatologist, and can always cancel it if is completely resolves.  If it is changing rapidly (ie growing in size, bleeding, not healing at all), then we may need to help you get a sooner appointment, or do the biopsy in our office if the wait for the dermatologist is too long.

## 2013-02-17 NOTE — Progress Notes (Signed)
Chief Complaint  Patient presents with  . Hypothyroidism    nonfasting med check, labs already done. All meds reconciled.    Dark spot on his left temple has now become raised and scabbed, just noticed yesterday.  Denies any pain or bleeding.  Dark spot has been noticed since he started wearing his hair shorter, for about 6 weeks, and only noticed a change yesterday.  Hypothyroidism:  He admits to missing about one tablet per week.  He takes the thyroid medication along with his psychiatric medication, takes it on an empty stomach.  He feels tired all the time, all he wants to do is sleep.  He feels that his depression is improved since adding the Brintillix.  He has been having some constipation, not improved with stool softeners, but hemorrhoids are no longer flaring.    Hypogonadism:  He took testosterone replacement only for about 2 months.  He didn't have any side effects, but is extremely concerned about risk for heart disease on this medication.  He didn't really notice much benefit while he was taking it, so he stopped.   Vitamin D deficiency:  He takes a MVI daily, unsure of dose of Vitamin D.  He was prescribed Vitamin D back in April, but never finished the course.  He still takes a tablet whenever he remembers, probably once a month or less.    Past Medical History  Diagnosis Date  . Depression     sees Dr. Evelene Croon  . Unspecified hypothyroidism   . Allergic rhinitis, cause unspecified     s/p immunotherapy  . Head injury 3/08    with facial fracture  . Diverticulosis 7/09  . Internal hemorrhoid 7/09  . Male hypogonadism 08-21-11    low testosterone   Past Surgical History  Procedure Laterality Date  . Shoulder surgery      bilateral (10/05 L; 8/07 R)  . Carpal tunnel release Right     right  . Orif finger fracture Right     R 3rd and 4th fingers  . Knee surgery Right 8/07    right, arthroscopic; Dr. Thurston Hole  . Inguinal hernia repair  08/21/99    Dr. Ezzard Standing  . Colonoscopy   10/07/07    Dr. Bosie Clos; diverticulosis and small internal hemorrhoids; repeat 10 yrs   History   Social History  . Marital Status: Single    Spouse Name: N/A    Number of Children: N/A  . 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 Smoking status: Never Smoker   . Smokeless tobacco: Never Used  . Alcohol Use: Yes     Comment: 1-2 beers per year.  . Drug Use: No  . Sexual Activity: Not Currently   Other Topics Concern  . Not on file   Social History Narrative   Moved in with his mother when his father got sick.  He passed away in Aug 21, 2011.  Mother's health has deteriorated.  2 dogs   Current Outpatient Prescriptions on File Prior to Visit  Medication Sig Dispense Refill  . dexmethylphenidate (FOCALIN XR) 20 MG 24 hr capsule Take 40 mg by mouth daily.        . Multiple Vitamins-Minerals (ONE-A-DAY 50 PLUS PO) Take 1 tablet by mouth daily.        Marland Kitchen olanzapine-FLUoxetine (SYMBYAX) 6-25 MG per capsule Take 1 capsule by mouth every evening.  No current facility-administered medications on file prior to visit.   Synthroid daily  No Known Allergies  PHYSICAL EXAM: BP 120/84  Pulse 72  Ht 5\' 10"  (1.778 m)  Wt 194 lb (87.998 kg)  BMI 27.84 kg/m2 Pleasant male, in no distress.  Affect is very flat. L temple--there is a scabbed lesion.  There is some irregularity to the pigment (but inflamed/scabbed).  Oval in shape. Neck: no lymphadenopathy, thyromegaly or bruit Heart: regular rate and rhythm without murmur Lungs: clear bilaterally Extremities: no edema Psych: flat affect.  Normal eye contact, speech, hygiene and grooming Neuro: alert and oriented,  Normal gait  Lab Results  Component Value Date   TSH 10.758* 01/25/2013   Lab Results  Component Value Date   PSA 1.36 01/25/2013   PSA 1.32 06/06/2011   Lab Results  Component Value Date   WBC 6.0 01/25/2013   HGB 15.0 01/25/2013   HCT  42.3 01/25/2013   MCV 86.7 01/25/2013   PLT 219 01/25/2013     Chemistry      Component Value Date/Time   NA 137 01/25/2013 1112   K 4.0 01/25/2013 1112   CL 104 01/25/2013 1112   CO2 24 01/25/2013 1112   BUN 14 01/25/2013 1112   CREATININE 0.99 01/25/2013 1112      Component Value Date/Time   CALCIUM 9.2 01/25/2013 1112   ALKPHOS 60 01/25/2013 1112   AST 18 01/25/2013 1112   ALT 13 01/25/2013 1112   BILITOT 0.6 01/25/2013 1112     Vitamin D 33  Lab Results  Component Value Date   TESTOSTERONE 190* 01/25/2013     ASSESSMENT/PLAN:  Unspecified hypothyroidism - some missed doses, but likely dose is too low.  increase to 125 mcg and recheck in 6 wks.  reviewed proper way to take med - Plan: levothyroxine (SYNTHROID) 125 MCG tablet, TSH  Need for prophylactic vaccination and inoculation against influenza - Plan: Flu Vaccine QUAD 36+ mos IM  Vitamin D deficiency - start 1000 IU daily in addition to multivitamin. stop sporadic use of rx Vitamin D  Hypogonadism male - reviewed risks/benefits/alternatives of treatments as well as risks of hypogonadism (untreated).  he will reconsider in future.   Use antibacterial ointment to lesion on scalp.  See if scab heals up, and goes back to looking like the same flat mole that has been present for years. If it does not heal, or if it remains raised, or irregularly colored, then needs to be biopsied.    Increase Synthroid from 100 to 125 mcg.  Discussed proper way to take.  Recheck TSH in 6-8 weeks.  Vitamin D deficiency.  Stop taking the rx. Start 1000 IU OTC vitamin D daily, in addition to MVI  Discussed risks/benefits of hypogonadism and of testosterone replacement in detail. He elects to hold off on treatment at this time, to get thyroid better regulated first, then will reconsider starting testosterone.  Discussed the importance of compliance with taking med, and with f/u to ensure no adverse reactions, and to ensure that dose is adequate (may not  have had benefit due to needing a higher dose, which would have been recognized on repeat lab testing if he continued on meds for the full 3 months.)  F/u TSH in 6 wks; CPE in 6 months

## 2013-06-26 ENCOUNTER — Other Ambulatory Visit: Payer: Self-pay | Admitting: Family Medicine

## 2013-06-28 ENCOUNTER — Telehealth: Payer: Self-pay | Admitting: Family Medicine

## 2013-06-28 NOTE — Telephone Encounter (Signed)
Please offer my apologies, but I am not taking new patients, and office isn't taking new Medicare

## 2013-06-29 NOTE — Telephone Encounter (Signed)
Pt informed

## 2013-07-01 ENCOUNTER — Telehealth: Payer: Self-pay | Admitting: Internal Medicine

## 2013-07-01 NOTE — Telephone Encounter (Signed)
[facility] @ [phone] on January 19th

## 2013-07-25 ENCOUNTER — Other Ambulatory Visit: Payer: Self-pay | Admitting: Family Medicine

## 2013-08-23 ENCOUNTER — Encounter: Payer: Managed Care, Other (non HMO) | Admitting: Family Medicine

## 2013-08-25 ENCOUNTER — Encounter: Payer: Self-pay | Admitting: Family Medicine

## 2013-09-02 ENCOUNTER — Other Ambulatory Visit: Payer: Self-pay | Admitting: Family Medicine

## 2013-09-03 ENCOUNTER — Telehealth: Payer: Self-pay | Admitting: Internal Medicine

## 2013-09-03 DIAGNOSIS — E039 Hypothyroidism, unspecified: Secondary | ICD-10-CM

## 2013-09-03 MED ORDER — LEVOTHYROXINE SODIUM 125 MCG PO TABS: 125.0000 ug | ORAL_TABLET | Freq: Every day | ORAL | Status: DC

## 2013-09-03 NOTE — Telephone Encounter (Signed)
Pt was suppose to come back in 6 weeks to get lab done from 02/17/13 visit but did not come back and then missed his cpe last week!! i told him that he needed to make an appt to be seen and have blood drawn. Pt had one day left on med, so i sent in a 30 day supply so he could have enough so he could come in.   PT IS AWARE ABOUT SCHEDULING AN APPT and was TOLD HE COULD NOT MISS ANOTHER APPT SINCE HE HAS ALREADY GOT A WARNING LETTER.

## 2013-09-27 ENCOUNTER — Telehealth: Payer: Self-pay | Admitting: Family Medicine

## 2013-09-27 NOTE — Telephone Encounter (Signed)
Pt apologized for missing his CPE last month, states they have changed his hours at work 12 on, 12 off, and he's all messed up.  States he will call back & reschedule his CPE when he gets his schedule because he can't miss more than 5 days a year or he is fired.  He wants to know if he can come in for labs for his thyroid or do you want to see him?

## 2013-09-27 NOTE — Telephone Encounter (Signed)
He has future orders in the system already--he was supposed to come 6 weeks after his November appt to recheck TSH, and never did.  If his thyroid is off (ie if he is missing pills, noncompliant), he will need to schedule an OV separate from waiting for another CPE.

## 2013-09-29 ENCOUNTER — Other Ambulatory Visit: Payer: Managed Care, Other (non HMO)

## 2013-09-29 DIAGNOSIS — E039 Hypothyroidism, unspecified: Secondary | ICD-10-CM

## 2013-09-29 LAB — TSH: TSH: 2.167 u[IU]/mL (ref 0.350–4.500)

## 2013-10-01 MED ORDER — LEVOTHYROXINE SODIUM 125 MCG PO TABS: 125.0000 ug | ORAL_TABLET | Freq: Every day | ORAL | Status: DC

## 2014-01-13 ENCOUNTER — Encounter: Payer: Self-pay | Admitting: Family Medicine

## 2014-01-13 ENCOUNTER — Ambulatory Visit (INDEPENDENT_AMBULATORY_CARE_PROVIDER_SITE_OTHER): Payer: Managed Care, Other (non HMO) | Admitting: Family Medicine

## 2014-01-13 VITALS — BP 122/92 | HR 88 | Ht 71.0 in | Wt 201.0 lb

## 2014-01-13 DIAGNOSIS — R3915 Urgency of urination: Secondary | ICD-10-CM

## 2014-01-13 DIAGNOSIS — E559 Vitamin D deficiency, unspecified: Secondary | ICD-10-CM

## 2014-01-13 DIAGNOSIS — Z125 Encounter for screening for malignant neoplasm of prostate: Secondary | ICD-10-CM

## 2014-01-13 DIAGNOSIS — Z5181 Encounter for therapeutic drug level monitoring: Secondary | ICD-10-CM

## 2014-01-13 DIAGNOSIS — E663 Overweight: Secondary | ICD-10-CM

## 2014-01-13 DIAGNOSIS — E291 Testicular hypofunction: Secondary | ICD-10-CM

## 2014-01-13 DIAGNOSIS — R6882 Decreased libido: Secondary | ICD-10-CM

## 2014-01-13 DIAGNOSIS — Z23 Encounter for immunization: Secondary | ICD-10-CM

## 2014-01-13 DIAGNOSIS — R5383 Other fatigue: Secondary | ICD-10-CM

## 2014-01-13 DIAGNOSIS — E039 Hypothyroidism, unspecified: Secondary | ICD-10-CM

## 2014-01-13 DIAGNOSIS — Z0000 Encounter for general adult medical examination without abnormal findings: Secondary | ICD-10-CM

## 2014-01-13 DIAGNOSIS — K4090 Unilateral inguinal hernia, without obstruction or gangrene, not specified as recurrent: Secondary | ICD-10-CM

## 2014-01-13 DIAGNOSIS — R0683 Snoring: Secondary | ICD-10-CM

## 2014-01-13 LAB — POCT URINALYSIS DIPSTICK
Bilirubin, UA: NEGATIVE
Glucose, UA: NEGATIVE
Ketones, UA: NEGATIVE
NITRITE UA: NEGATIVE
PH UA: 5
PROTEIN UA: NEGATIVE
RBC UA: NEGATIVE
SPEC GRAV UA: 1.02
Urobilinogen, UA: NEGATIVE

## 2014-01-13 LAB — CBC WITH DIFFERENTIAL/PLATELET
BASOS ABS: 0 10*3/uL (ref 0.0–0.1)
BASOS PCT: 0 % (ref 0–1)
Eosinophils Absolute: 0.1 10*3/uL (ref 0.0–0.7)
Eosinophils Relative: 1 % (ref 0–5)
HCT: 45.1 % (ref 39.0–52.0)
Hemoglobin: 15.5 g/dL (ref 13.0–17.0)
Lymphocytes Relative: 12 % (ref 12–46)
Lymphs Abs: 1.1 10*3/uL (ref 0.7–4.0)
MCH: 29.6 pg (ref 26.0–34.0)
MCHC: 34.4 g/dL (ref 30.0–36.0)
MCV: 86.1 fL (ref 78.0–100.0)
MONO ABS: 0.8 10*3/uL (ref 0.1–1.0)
Monocytes Relative: 9 % (ref 3–12)
NEUTROS ABS: 6.9 10*3/uL (ref 1.7–7.7)
Neutrophils Relative %: 78 % — ABNORMAL HIGH (ref 43–77)
PLATELETS: 271 10*3/uL (ref 150–400)
RBC: 5.24 MIL/uL (ref 4.22–5.81)
RDW: 15 % (ref 11.5–15.5)
WBC: 8.8 10*3/uL (ref 4.0–10.5)

## 2014-01-13 NOTE — Progress Notes (Signed)
Chief Complaint  Patient presents with  . Annual Exam    fasting annual/med check. Does complain that he sleeps excessively, all weekend long. Complains of urinary urgency also. (all meds reconciled)   Johnathan Estrada is a 58 y.o. male who presents for a complete physical.  He has the following concerns:  He is having trouble with some urinary urgency and rare urge incontinence.  He drinks a lot of soft drinks--regular Coke, 3-4/day.  Urine stream starts quickly, stream is unchanged (not particularly strong).  Sometimes he gets urge, but volume is small. Denies abdominal pain, fevers, flank pain.  Denies penile discharge, not in a sexual relationship currently.  Hypothyroidism:  He had labs done in July.  He has been compliant with taking his medications.  Denies changes in energy, hair, skin, bowels.  He had a while when he had BM's only every other day, but is back to daily now. He has ongoing fatigue (see below).  Depression:  Under the care of Dr. Toy Care.  He is less depressed overall, doing better since on Symbyax, thinks adding the Brintellix also helped.  He admits that he had been out of the Brintellix for a month as he was past due to f/u with Dr. Toy Care. He just got it refilled.  Hypogonadism:  At one point he was prescribed a testosterone replacement.  He only used it for a couple of weeks, but then read about it and got scared.  He has ongoing fatigue.  He feels like he needs to take a nap every day.  His mother states he snores loudly.  Unsure if he has apnea.  He only sometimes feels refreshed when he wakes up.  When he doesn't have to work, he stays in bed all day.  No libido, +ED. No regular exercise.  Immunization History  Administered Date(s) Administered  . Influenza,inj,Quad PF,36+ Mos 02/17/2013  . Td 01/24/2003  . Tdap 11/22/2010   Last colonoscopy: 7/09  Last PSA: 01/2013 Dentist: past due Ophtho: more than 2 years ago  Exercise: none--only the stairs at work  Past  Medical History  Diagnosis Date  . Depression     sees Dr. Toy Care  . Unspecified hypothyroidism   . Allergic rhinitis, cause unspecified     s/p immunotherapy  . Head injury 3/08    with facial fracture  . Diverticulosis 7/09  . Internal hemorrhoid 7/09  . Male hypogonadism 2013    low testosterone    Past Surgical History  Procedure Laterality Date  . Shoulder surgery      bilateral (10/05 L; 8/07 R)  . Carpal tunnel release Right     right  . Orif finger fracture Right     R 3rd and 4th fingers  . Knee surgery Right 8/07    right, arthroscopic; Dr. Noemi Chapel  . Inguinal hernia repair  2001    Dr. Lucia Gaskins  . Colonoscopy  10/07/07    Dr. Michail Sermon; diverticulosis and small internal hemorrhoids; repeat 10 yrs    History   Social History  . Marital Status: Single    Spouse Name: N/A    Number of Children: N/A  . Years of Education: N/A   Occupational History  . Dealer for [employer]    Social History Main Topics  . Smoking status: Never Smoker   . Smokeless tobacco: Never Used  . Alcohol Use: Yes     Comment: 1-2 beers per year.  . Drug Use: No  . Sexual Activity: Not  Currently   Other Topics Concern  . Not on file   Social History Narrative   Moved in with his mother when his father got sick.  He passed away in Jun 15, 2011.  Mother's health has deteriorated.  3 dogs.  He still has his own house, and dog, but stays at his mother's.    Family History  Problem Relation Age of Onset  . Diabetes Mother   . Heart disease Mother     irregular heartbeat  . COPD Mother   . Diabetes Brother   . Cancer Maternal Uncle     colon cancer  . Stroke Maternal Grandmother   . Heart disease Maternal Aunt   . Heart disease Maternal Uncle     Outpatient Encounter Prescriptions as of 01/13/2014  Medication Sig Note  . Docusate Calcium (STOOL SOFTENER PO) Take 1 tablet by mouth daily.   Marland Kitchen levothyroxine (SYNTHROID) 125 MCG tablet Take 1 tablet (125 mcg total) by mouth daily  before breakfast.   . olanzapine-FLUoxetine (SYMBYAX) 6-25 MG per capsule Take 1 capsule by mouth every evening.     Marland Kitchen BRINTELLIX 10 MG TABS Take 1 tablet by mouth daily.  01/13/2014: Off for about a month (ran out), just got it refilled.  Marland Kitchen Dexmethylphenidate HCl 40 MG CP24 Take 1 capsule by mouth daily.  01/13/2014: He hasn't taken this in about a month; not sure that he can tell a difference  . Multiple Vitamins-Minerals (ONE-A-DAY 50 PLUS PO) Take 1 tablet by mouth daily.   01/13/2014: Ran out months ago  . [DISCONTINUED] dexmethylphenidate (FOCALIN XR) 20 MG 24 hr capsule Take 40 mg by mouth daily.     . [DISCONTINUED] SYNTHROID 100 MCG tablet TAKE 1 TABLET BY MOUTH DAILY   . [DISCONTINUED] SYNTHROID 100 MCG tablet TAKE 1 TABLET BY MOUTH DAILY     No Known Allergies  ROS: The patient denies anorexia, fever, headaches, vision changes, decreased hearing, ear pain, hoarseness, chest pain, palpitations, dizziness, syncope, dyspnea on exertion, cough, swelling, nausea, vomiting, diarrhea, constipation, abdominal pain, melena, hematochezia, indigestion/heartburn, hematuria, numbness, tingling, weakness, tremor, suspicious skin lesions, abnormal bleeding/bruising, or enlarged lymph nodes  +decreased libido, lack of energy. Depression seems a little better, + anxiety. Denies nocturia (maybe once a night).  +urinary complaints as per HPI. +weight gain  PHYSICAL EXAM:  BP 122/92  Pulse 88  Ht '5\' 11"'  (1.803 m)  Wt 201 lb (91.173 kg)  BMI 28.05 kg/m2  General Appearance:  Alert, cooperative, no distress, appears stated age   Head:  Normocephalic, without obvious abnormality, atraumatic   Eyes:  PERRL, conjunctiva/corneas clear, EOM's intact, fundi  benign   Ears:  Normal TM's and external ear canals   Nose:  Nares normal, mucosa normal, no drainage or sinus tenderness   Throat:  Lips, mucosa, and tongue normal; teeth and gums normal   Neck:  Supple, no lymphadenopathy; thyroid: no  enlargement/tenderness/nodules; no carotid  bruit or JVD   Back:  Spine nontender, no curvature, ROM normal, no CVA tenderness   Lungs:  Clear to auscultation bilaterally without wheezes, rales or ronchi; respirations unlabored   Chest Wall:  No tenderness or deformity   Heart:  Regular rate and rhythm, S1 and S2 normal, no murmur, rub  or gallop   Breast Exam:  No chest wall tenderness, masses or gynecomastia   Abdomen:  Soft, non-tender, nondistended, normoactive bowel sounds,  no masses, no hepatosplenomegaly   Genitalia:  Normal male external genitalia without lesions. Testicles without masses.  Small inguinal hernia on the right; reducible, nontender   Rectal:  Normal sphincter tone, no masses or tenderness; guaiac negative stool. Prostate smooth, mildly enlarged, no nodules. Nonboggy, nontender   Extremities:  No clubbing, cyanosis or edema   Pulses:  2+ and symmetric all extremities   Skin:  Skin color, texture, turgor normal, no rashes or lesions   Lymph nodes:  Cervical, supraclavicular, and axillary nodes normal   Neurologic:  CNII-XII intact, normal strength, sensation and gait; reflexes 2+ and symmetric throughout          Psych: Normal mood, hygiene and grooming, eye contact and speech. Flat affect   Lab Results  Component Value Date   TSH 2.167 09/29/2013   ASSESSMENT/PLAN:  Annual physical exam - Plan: POCT Urinalysis Dipstick, Visual acuity screening, Lipid panel, Comprehensive metabolic panel, CBC with Differential, PSA  Urinary urgency - r/o infection.  no e/o prostatitis. Cut back on caffeine - Plan: POCT Urinalysis Dipstick, Urine culture  Need for prophylactic vaccination and inoculation against influenza - Plan: Flu Vaccine QUAD 36+ mos PF IM (Fluarix Quad PF)  Other fatigue - Plan: Comprehensive metabolic panel, CBC with Differential, Vit D  25 hydroxy (rtn osteoporosis monitoring)  Hypothyroidism, unspecified hypothyroidism type - adequately replaced per recent  labs.  continue current dose  Hypogonadism male - reviewed risks/benefits of untreated hypogonadism and of replacement therapy - Plan: PSA, Testosterone  Vitamin D deficiency - noncompliant with taking supplements.  recheck today - Plan: Vit D  25 hydroxy (rtn osteoporosis monitoring)  Decreased libido - related to low testosterone and/or psych meds.  consider testosterone replacement therapy - Plan: Testosterone  Screening for prostate cancer - Plan: PSA  Medication monitoring encounter - Plan: PSA  Right inguinal hernia - reducible.  reviewed s/sx that would require immediate evaluation  Overweight (BMI 25.0-29.9) - cut out regular sodas; healthy diet; regular exercise  Snoring  c-met, CBC, PSA, lipid, testosterone, Vitamin D  Discussed PSA screening (risks/benefits), recommended at least 30 minutes of aerobic activity at least 5 days/week; proper sunscreen use reviewed; healthy diet and alcohol recommendations (less than or equal to 2 drinks/day) reviewed; regular seatbelt use; changing batteries in smoke detectors. Self-testicular exams. Immunization recommendations discussed--flu shot given. Colonoscopy recommendations reviewed--UTD   Schedule appointments with dentist and eye doctor. You need to cut back on caffeine, to see if this helps with your urinary problems. Cut back on regular sodas in order to lose weight.  Refer for Split night sleep study. Risks of sleep apnea (and need to diagnose/treat) reviewed.

## 2014-01-13 NOTE — Patient Instructions (Addendum)
HEALTH MAINTENANCE RECOMMENDATIONS:  It is recommended that you get at least 30 minutes of aerobic exercise at least 5 days/week (for weight loss, you may need as much as 60-90 minutes). This can be any activity that gets your heart rate up. This can be divided in 10-15 minute intervals if needed, but try and build up your endurance at least once a week.  Weight bearing exercise is also recommended twice weekly.  Eat a healthy diet with lots of vegetables, fruits and fiber.  "Colorful" foods have a lot of vitamins (ie green vegetables, tomatoes, red peppers, etc).  Limit sweet tea, regular sodas and alcoholic beverages, all of which has a lot of calories and sugar.  Up to 2 alcoholic drinks daily may be beneficial for men (unless trying to lose weight, watch sugars).  Drink a lot of water.  Sunscreen of at least SPF 30 should be used on all sun-exposed parts of the skin when outside between the hours of 10 am and 4 pm (not just when at beach or pool, but even with exercise, golf, tennis, and yard work!)  Use a sunscreen that says "broad spectrum" so it covers both UVA and UVB rays, and make sure to reapply every 1-2 hours.  Remember to change the batteries in your smoke detectors when changing your clock times in the spring and fall.  Use your seat belt every time you are in a car, and please drive safely and not be distracted with cell phones and texting while driving.  Schedule appointments with dentist and eye doctor. You need to cut back on caffeine, to see if this helps with your urinary problems. Cut back on regular sodas in order to lose weight.  Restart your multivitamin in order to get daily vitamin D.  We will let you know if the level is so low that a prescription is needed  Inguinal Hernia, Adult Muscles help keep everything in the body in its proper place. But if a weak spot in the muscles develops, something can poke through. That is called a hernia. When this happens in the lower  part of the belly (abdomen), it is called an inguinal hernia. (It takes its name from a part of the body in this region called the inguinal canal.) A weak spot in the wall of muscles lets some fat or part of the small intestine bulge through. An inguinal hernia can develop at any age. Men get them more often than women. CAUSES  In adults, an inguinal hernia develops over time.  It can be triggered by:  Suddenly straining the muscles of the lower abdomen.  Lifting heavy objects.  Straining to have a bowel movement. Difficult bowel movements (constipation) can lead to this.  Constant coughing. This may be caused by smoking or lung disease.  Being overweight.  Being pregnant.  Working at a job that requires long periods of standing or heavy lifting.  Having had an inguinal hernia before. One type can be an emergency situation. It is called a strangulated inguinal hernia. It develops if part of the small intestine slips through the weak spot and cannot get back into the abdomen. The blood supply can be cut off. If that happens, part of the intestine may die. This situation requires emergency surgery. SYMPTOMS  Often, a small inguinal hernia has no symptoms. It is found when a healthcare provider does a physical exam. Larger hernias usually have symptoms.   In adults, symptoms may include:  A lump in the  groin. This is easier to see when the person is standing. It might disappear when lying down.  In men, a lump in the scrotum.  Pain or burning in the groin. This occurs especially when lifting, straining or coughing.  A dull ache or feeling of pressure in the groin.  Signs of a strangulated hernia can include:  A bulge in the groin that becomes very painful and tender to the touch.  A bulge that turns red or purple.  Fever, nausea and vomiting.  Inability to have a bowel movement or to pass gas. DIAGNOSIS  To decide if you have an inguinal hernia, a healthcare provider will  probably do a physical examination.  This will include asking questions about any symptoms you have noticed.  The healthcare provider might feel the groin area and ask you to cough. If an inguinal hernia is felt, the healthcare provider may try to slide it back into the abdomen.  Usually no other tests are needed. TREATMENT  Treatments can vary. The size of the hernia makes a difference. Options include:  Watchful waiting. This is often suggested if the hernia is small and you have had no symptoms.  No medical procedure will be done unless symptoms develop.  You will need to watch closely for symptoms. If any occur, contact your healthcare provider right away.  Surgery. This is used if the hernia is larger or you have symptoms.  Open surgery. This is usually an outpatient procedure (you will not stay overnight in a hospital). An cut (incision) is made through the skin in the groin. The hernia is put back inside the abdomen. The weak area in the muscles is then repaired by herniorrhaphy or hernioplasty. Herniorrhaphy: in this type of surgery, the weak muscles are sewn back together. Hernioplasty: a patch or mesh is used to close the weak area in the abdominal wall.  Laparoscopy. In this procedure, a surgeon makes small incisions. A thin tube with a tiny video camera (called a laparoscope) is put into the abdomen. The surgeon repairs the hernia with mesh by looking with the video camera and using two long instruments. HOME CARE INSTRUCTIONS   After surgery to repair an inguinal hernia:  You will need to take pain medicine prescribed by your healthcare provider. Follow all directions carefully.  You will need to take care of the wound from the incision.  Your activity will be restricted for awhile. This will probably include no heavy lifting for several weeks. You also should not do anything too active for a few weeks. When you can return to work will depend on the type of job that you  have.  During "watchful waiting" periods, you should:  Maintain a healthy weight.  Eat a diet high in fiber (fruits, vegetables and whole grains).  Drink plenty of fluids to avoid constipation. This means drinking enough water and other liquids to keep your urine clear or pale yellow.  Do not lift heavy objects.  Do not stand for long periods of time.  Quit smoking. This should keep you from developing a frequent cough. SEEK MEDICAL CARE IF:   A bulge develops in your groin area.  You feel pain, a burning sensation or pressure in the groin. This might be worse if you are lifting or straining.  You develop a fever of more than 100.5 F (38.1 C). SEEK IMMEDIATE MEDICAL CARE IF:   Pain in the groin increases suddenly.  A bulge in the groin gets bigger suddenly and  does not go down.  For men, there is sudden pain in the scrotum. Or, the size of the scrotum increases.  A bulge in the groin area becomes red or purple and is painful to touch.  You have nausea or vomiting that does not go away.  You feel your heart beating much faster than normal.  You cannot have a bowel movement or pass gas.  You develop a fever of more than 102.0 F (38.9 C). Document Released: 07/28/2008 Document Revised: 06/03/2011 Document Reviewed: 07/28/2008 [facility] Patient Information 2015 [place], [place]. This information is not intended to replace advice given to you by your health care provider. Make sure you discuss any questions you have with your health care provider.

## 2014-01-14 LAB — COMPREHENSIVE METABOLIC PANEL
ALT: 22 U/L (ref 0–53)
AST: 23 U/L (ref 0–37)
Albumin: 4.4 g/dL (ref 3.5–5.2)
Alkaline Phosphatase: 69 U/L (ref 39–117)
BUN: 16 mg/dL (ref 6–23)
CALCIUM: 9.2 mg/dL (ref 8.4–10.5)
CHLORIDE: 100 meq/L (ref 96–112)
CO2: 28 mEq/L (ref 19–32)
CREATININE: 1.12 mg/dL (ref 0.50–1.35)
Glucose, Bld: 90 mg/dL (ref 70–99)
POTASSIUM: 4.1 meq/L (ref 3.5–5.3)
SODIUM: 138 meq/L (ref 135–145)
TOTAL PROTEIN: 8 g/dL (ref 6.0–8.3)
Total Bilirubin: 0.8 mg/dL (ref 0.2–1.2)

## 2014-01-14 LAB — LIPID PANEL
Cholesterol: 211 mg/dL — ABNORMAL HIGH (ref 0–200)
HDL: 35 mg/dL — ABNORMAL LOW (ref >39)
LDL CALC: 133 mg/dL — AB (ref 0–99)
Total CHOL/HDL Ratio: 6 Ratio
Triglycerides: 213 mg/dL — ABNORMAL HIGH (ref <150)
VLDL: 43 mg/dL — AB (ref 0–40)

## 2014-01-14 LAB — URINE CULTURE
COLONY COUNT: NO GROWTH
ORGANISM ID, BACTERIA: NO GROWTH

## 2014-01-14 LAB — PSA: PSA: 1.11 ng/mL (ref <=4.00)

## 2014-01-14 LAB — TESTOSTERONE: TESTOSTERONE: 143 ng/dL — AB (ref 300–890)

## 2014-01-14 LAB — VITAMIN D 25 HYDROXY (VIT D DEFICIENCY, FRACTURES): Vit D, 25-Hydroxy: 25 ng/mL — ABNORMAL LOW (ref 30–89)

## 2014-01-17 ENCOUNTER — Other Ambulatory Visit: Payer: Self-pay | Admitting: *Deleted

## 2014-01-17 MED ORDER — ERGOCALCIFEROL 1.25 MG (50000 UT) PO CAPS: 50000.0000 [IU] | ORAL_CAPSULE | ORAL | Status: DC

## 2014-01-17 NOTE — Telephone Encounter (Signed)
Done

## 2014-03-16 ENCOUNTER — Encounter: Payer: Managed Care, Other (non HMO) | Admitting: Family Medicine

## 2014-05-09 ENCOUNTER — Other Ambulatory Visit: Payer: Self-pay | Admitting: Family Medicine

## 2014-06-29 ENCOUNTER — Telehealth: Payer: Self-pay | Admitting: Family Medicine

## 2014-06-29 MED ORDER — LEVOTHYROXINE SODIUM 125 MCG PO TABS: 125.0000 ug | ORAL_TABLET | Freq: Every day | ORAL | Status: DC

## 2014-06-29 NOTE — Telephone Encounter (Signed)
Done

## 2014-06-29 NOTE — Telephone Encounter (Signed)
Ok for generic for 3 mos.  He will be due for TSH in July

## 2014-06-29 NOTE — Telephone Encounter (Signed)
PT called and states lost his job and wants generic [facility]  Pt ph [phone]

## 2014-09-12 ENCOUNTER — Encounter: Payer: Self-pay | Admitting: Family Medicine

## 2014-09-12 ENCOUNTER — Ambulatory Visit (INDEPENDENT_AMBULATORY_CARE_PROVIDER_SITE_OTHER): Payer: Self-pay | Admitting: Family Medicine

## 2014-09-12 VITALS — BP 128/82 | HR 92 | Ht 71.0 in | Wt 199.0 lb

## 2014-09-12 DIAGNOSIS — M25571 Pain in right ankle and joints of right foot: Secondary | ICD-10-CM

## 2014-09-12 NOTE — Patient Instructions (Signed)
If no fracture is present, we will treat as a sprain.  Use anti-inflammatories (aleve or motrin) for pain and inflammation (up to 2 aleve twice daily with food, or  of ibuprofen three times daily with food). Use support--ace wrap or ankle brace. You should not be climbing ladders/stairs until you can do so without any pain. I suspect you will need the rest of the week to recover. If x-ray is abnormal, that is a different story.  Ankle Sprain An ankle sprain is an injury to the strong, fibrous tissues (ligaments) that hold the bones of your ankle joint together.  CAUSES An ankle sprain is usually caused by a fall or by twisting your ankle. Ankle sprains most commonly occur when you step on the outer edge of your foot, and your ankle turns inward. People who participate in sports are more prone to these types of injuries.  SYMPTOMS   Pain in your ankle. The pain may be present at rest or only when you are trying to stand or walk.  Swelling.  Bruising. Bruising may develop immediately or within 1 to 2 days after your injury.  Difficulty standing or walking, particularly when turning corners or changing directions. DIAGNOSIS  Your caregiver will ask you details about your injury and perform a physical exam of your ankle to determine if you have an ankle sprain. During the physical exam, your caregiver will press on and apply pressure to specific areas of your foot and ankle. Your caregiver will try to move your ankle in certain ways. An X-ray exam may be done to be sure a bone was not broken or a ligament did not separate from one of the bones in your ankle (avulsion fracture).  TREATMENT  Certain types of braces can help stabilize your ankle. Your caregiver can make a recommendation for this. Your caregiver may recommend the use of medicine for pain. If your sprain is severe, your caregiver may refer you to a surgeon who helps to restore function to parts of your skeletal system  (orthopedist) or a physical therapist. HOME CARE INSTRUCTIONS   Apply ice to your injury for 1-2 days or as directed by your caregiver. Applying ice helps to reduce inflammation and pain.  Put ice in a plastic bag.  Place a towel between your skin and the bag.  Leave the ice on for 15-20 minutes at a time, every 2 hours while you are awake.  Only take over-the-counter or prescription medicines for pain, discomfort, or fever as directed by your caregiver.  Elevate your injured ankle above the level of your heart as much as possible for 2-3 days.  If your caregiver recommends crutches, use them as instructed. Gradually put weight on the affected ankle. Continue to use crutches or a cane until you can walk without feeling pain in your ankle.  If you have a plaster splint, wear the splint as directed by your caregiver. Do not rest it on anything harder than a pillow for the first 24 hours. Do not put weight on it. Do not get it wet. You may take it off to take a shower or bath.  You may have been given an elastic bandage to wear around your ankle to provide support. If the elastic bandage is too tight (you have numbness or tingling in your foot or your foot becomes cold and blue), adjust the bandage to make it comfortable.  If you have an air splint, you may blow more air into it or let air  out to make it more comfortable. You may take your splint off at night and before taking a shower or bath. Wiggle your toes in the splint several times per day to decrease swelling. SEEK MEDICAL CARE IF:   You have rapidly increasing bruising or swelling.  Your toes feel extremely cold or you lose feeling in your foot.  Your pain is not relieved with medicine. SEEK IMMEDIATE MEDICAL CARE IF:  Your toes are numb or blue.  You have severe pain that is increasing. MAKE SURE YOU:   Understand these instructions.  Will watch your condition.  Will get help right away if you are not doing well or get  worse. Document Released: 03/11/2005 Document Revised: 12/04/2011 Document Reviewed: 03/23/2011 [facility] Patient Information 2015 [place], [place]. This information is not intended to replace advice given to you by your health care provider. Make sure you discuss any questions you have with your health care provider.

## 2014-09-12 NOTE — Progress Notes (Signed)
Chief Complaint  Patient presents with  . Ankle Injury    fell and twisted ankle coming down steps on deck. Would like to know if you would treat for sprain instead of having xrays done as he in uninsured at the [employer] job. Was supposed to start two new job today that he would need to climb 71ft ladders and knew that he could not. Both employers said they would work with him he could get a note/release of work note.    6/18, while helping a friend, while walking down deck stairs he fell (was in process of being built, wasn't nailed down).   He twisted his right ankle.  He was able to walk on after--it happened in the evening, and pretty much went home to bed.  He didn't pay attention to any particular swelling (he went to bed shortly after, occurred in the evening).  The following morning he noticed swelling--it looks a lot less swollen now than yesterday.    He has been icing the ankle.  Pain is improving some, but still hurts.  He was supposed to start 2 jobs today, both of which require climbing 40-50 ft ladders. Able to drive without significant discomfort. Hurts to walk, but he can bear weight.  PMH, PSH, SH reviewed  Outpatient Encounter Prescriptions as of 09/12/2014  Medication Sig Note  . FLUoxetine (PROZAC) 40 MG capsule Take 40 mg by mouth daily. 09/12/2014: Received from: External Pharmacy  . levothyroxine (SYNTHROID, LEVOTHROID) 125 MCG tablet Take 1 tablet (125 mcg total) by mouth daily before breakfast. 09/12/2014: Admits to missing a pill or two once or twice a week  . Dexmethylphenidate HCl 40 MG CP24 Take 1 capsule by mouth daily.  01/13/2014: He hasn't taken this in about a month; not sure that he can tell a difference  . Docusate Calcium (STOOL SOFTENER PO) Take 1 tablet by mouth daily.   . ergocalciferol (VITAMIN D2) 50000 UNITS capsule Take 1 capsule (50,000 Units total) by mouth once a week. (Patient not taking: Reported on 09/12/2014)   . Multiple Vitamins-Minerals  (ONE-A-DAY 50 PLUS PO) Take 1 tablet by mouth daily.   01/13/2014: Ran out months ago  . olanzapine-FLUoxetine (SYMBYAX) 6-25 MG per capsule Take 1 capsule by mouth every evening.     . [DISCONTINUED] BRINTELLIX 10 MG TABS Take 1 tablet by mouth daily.  01/13/2014: Off for about a month (ran out), just got it refilled.   No facility-administered encounter medications on file as of 09/12/2014.   No Known Allergies  ROS: no fever, chills, URI symptoms, GI complaints, bleeding, bruising, rash. See HPI  PHYSICAL EXAM: BP 128/82 mmHg  Pulse 92  Ht  (1.803 m)  Wt 199 lb (90.266 kg)  BMI 27.77 kg/m2 Well developed, pleasant male in no distress.  He appears to be in good spirits with full range of affect.  Normal speech, eye contact, hygiene and grooming  Right lower extremity: 2+ pulses.   Mild soft tissue swelling laterally at ankle. He is very tender along the superior aspect of the lateral malleolus, extending over much of the malleolus (but most tender superior and anterior).  There is no tenderness over ATFL and inferior to malleolus.  Negative drawer.  ASSESSMENT/PLAN:  Right ankle pain - Plan: DG Ankle Complete Right   Given bony tenderness, x-ray is recommended. Concern for costs--call to check on prices at [facility], [employer] vs [employer]. Order entered for [place] [facility].  Rx given for other places.  If no fracture is present, we will treat as a sprain.  Use anti-inflammatories (aleve or motrin) for pain and inflammation (up to 2 aleve twice daily with food, or 800mg  of ibuprofen three times daily with food). Use support--ace wrap or ankle brace. You should not be climbing ladders/stairs until you can do so without any pain. I suspect you will need the rest of the week to recover. If x-ray is abnormal, that is a different story.   Addendum:  X-ray normal. Plantar calcaneal spur noted incidentally. Pt advised of results.  To call us Friday or Monday  (will need a release to work note when able to ambulate and climb ladders without pain).

## 2014-09-15 ENCOUNTER — Telehealth: Payer: Self-pay | Admitting: Family Medicine

## 2014-09-15 NOTE — Telephone Encounter (Signed)
Ok for note for the rest of this week.  He should return Monday for re-eval if still in significant pain/swelling for re-eval

## 2014-09-15 NOTE — Telephone Encounter (Signed)
Pt says ankle is not better so requesting another work note since his two jobs require that he climb ladders and pt even having difficulty with steps.

## 2014-09-16 ENCOUNTER — Encounter: Payer: Self-pay | Admitting: Family Medicine

## 2014-09-16 NOTE — Telephone Encounter (Signed)
Pt called me back I gave him Dr Delford Field instructions and explained to him that he has a note taking him out of work for the week and asked if he works during the weekend. His work week starts on Sunday so he does need the note to excuse him for Sunday. Will give him that. Pt wants to know if he can also get it to cover Monday because his manger wants the notes today and pt says they need it for Monday in case he comes in for the appt and is not able to go to work. Pt still has quite a bit of swelling and pain today but will watch ankle over the weekend. Is it ok for note to cover Monday?

## 2014-09-16 NOTE — Telephone Encounter (Signed)
Wrote note. Called pt to advise that note ready for pick up. He made an appt for Monday just in case he need it. Pt will call on Monday if he is better and do not need the appt.

## 2014-09-16 NOTE — Telephone Encounter (Signed)
Ok for Monday also, but will need eval for any additional notes

## 2014-09-16 NOTE — Telephone Encounter (Signed)
Attempted to call pt with instructions from Dr Lynelle Doctor but he did not answer and his voice mailbox was full so I could not leave a message.

## 2014-09-19 ENCOUNTER — Encounter: Payer: Self-pay | Admitting: Family Medicine

## 2014-09-19 ENCOUNTER — Ambulatory Visit (INDEPENDENT_AMBULATORY_CARE_PROVIDER_SITE_OTHER): Payer: Self-pay | Admitting: Family Medicine

## 2014-09-19 VITALS — BP 130/78 | HR 84 | Temp 98.0°F | Wt 199.4 lb

## 2014-09-19 DIAGNOSIS — S93491D Sprain of other ligament of right ankle, subsequent encounter: Secondary | ICD-10-CM

## 2014-09-19 DIAGNOSIS — S93431D Sprain of tibiofibular ligament of right ankle, subsequent encounter: Secondary | ICD-10-CM

## 2014-09-19 NOTE — Patient Instructions (Signed)
Take Aleve 1-2 tablets twice daily with food over the next week. If it bothers your stomach, cut back to just 1 tablet at a time.  Do not use any ibuprofen, advil, Goody, BC or other pain medication at the same time.  Tylenol is fine to take with it, if both are needed for pain.  Do the home exercises as per the handout. Stay out of work for another week, maybe will need longer depending on how you feel. Let us know next week if you need an additional note.  Use an ace wrap or ankle brace while walking/weight-bearing if it causes pain, or if you are swelling.  Don't use it if it causes more pain.  Acute Ankle Sprain with Phase I Rehab An acute ankle sprain is a partial or complete tear in one or more of the ligaments of the ankle due to traumatic injury. The severity of the injury depends on both the number of ligaments sprained and the grade of sprain. There are 3 grades of sprains.   A grade 1 sprain is a mild sprain. There is a slight pull without obvious tearing. There is no loss of strength, and the muscle and ligament are the correct length.  A grade 2 sprain is a moderate sprain. There is tearing of fibers within the substance of the ligament where it connects two bones or two cartilages. The length of the ligament is increased, and there is usually decreased strength.  A grade 3 sprain is a complete rupture of the ligament and is uncommon. In addition to the grade of sprain, there are three types of ankle sprains.  Lateral ankle sprains: This is a sprain of one or more of the three ligaments on the outer side (lateral) of the ankle. These are the most common sprains. Medial ankle sprains: There is one large triangular ligament of the inner side (medial) of the ankle that is susceptible to injury. Medial ankle sprains are less common. Syndesmosis, "high ankle," sprains: The syndesmosis is the ligament that connects the two bones of the lower leg. Syndesmosis sprains usually only occur  with very severe ankle sprains. SYMPTOMS  Pain, tenderness, and swelling in the ankle, starting at the side of injury that may progress to the whole ankle and foot with time.  "Pop" or tearing sensation at the time of injury.  Bruising that may spread to the heel.  Impaired ability to walk soon after injury. CAUSES   Acute ankle sprains are caused by trauma placed on the ankle that temporarily forces or pries the anklebone (talus) out of its normal socket.  Stretching or tearing of the ligaments that normally hold the joint in place (usually due to a twisting injury). RISK INCREASES WITH:  Previous ankle sprain.  Sports in which the foot may land awkwardly (i.e., basketball, volleyball, or soccer) or walking or running on uneven or rough surfaces.  Shoes with inadequate support to prevent sideways motion when stress occurs.  Poor strength and flexibility.  Poor balance skills.  Contact sports. PREVENTION   Warm up and stretch properly before activity.  Maintain physical fitness:  Ankle and leg flexibility, muscle strength, and endurance.  Cardiovascular fitness.  Balance training activities.  Use proper technique and have a coach correct improper technique.  Taping, protective strapping, bracing, or high-top tennis shoes may help prevent injury. Initially, tape is best; however, it loses most of its support function within 10 to 15 minutes.  Wear proper-fitted protective shoes (High-top shoes with taping  or bracing is more effective than either alone).  Provide the ankle with support during sports and practice activities for 12 months following injury. PROGNOSIS   If treated properly, ankle sprains can be expected to recover completely; however, the length of recovery depends on the degree of injury.  A grade 1 sprain usually heals enough in 5 to 7 days to allow modified activity and requires an average of 6 weeks to heal completely.  A grade 2 sprain requires 6  to 10 weeks to heal completely.  A grade 3 sprain requires 12 to 16 weeks to heal.  A syndesmosis sprain often takes more than 3 months to heal. RELATED COMPLICATIONS   Frequent recurrence of symptoms may result in a chronic problem. Appropriately addressing the problem the first time decreases the frequency of recurrence and optimizes healing time. Severity of the initial sprain does not predict the likelihood of later instability.  Injury to other structures (bone, cartilage, or tendon).  A chronically unstable or arthritic ankle joint is a possibility with repeated sprains. TREATMENT Treatment initially involves the use of ice, medication, and compression bandages to help reduce pain and inflammation. Ankle sprains are usually immobilized in a walking cast or boot to allow for healing. Crutches may be recommended to reduce pressure on the injury. After immobilization, strengthening and stretching exercises may be necessary to regain strength and a full range of motion. Surgery is rarely needed to treat ankle sprains. MEDICATION   Nonsteroidal anti-inflammatory medications, such as aspirin and ibuprofen (do not take for the first 3 days after injury or within 7 days before surgery), or other minor pain relievers, such as acetaminophen, are often recommended. Take these as directed by your caregiver. Contact your caregiver immediately if any bleeding, stomach upset, or signs of an allergic reaction occur from these medications.  Ointments applied to the skin may be helpful.  Pain relievers may be prescribed as necessary by your caregiver. Do not take prescription pain medication for longer than 4 to 7 days. Use only as directed and only as much as you need. HEAT AND COLD  Cold treatment (icing) is used to relieve pain and reduce inflammation for acute and chronic cases. Cold should be applied for 10 to 15 minutes every 2 to 3 hours for inflammation and pain and immediately after any activity  that aggravates your symptoms. Use ice packs or an ice massage.  Heat treatment may be used before performing stretching and strengthening activities prescribed by your caregiver. Use a heat pack or a warm soak. SEEK IMMEDIATE MEDICAL CARE IF:   Pain, swelling, or bruising worsens despite treatment.  You experience pain, numbness, discoloration, or coldness in the foot or toes.  New, unexplained symptoms develop (drugs used in treatment may produce side effects.) EXERCISES  PHASE I EXERCISES RANGE OF MOTION (ROM) AND STRETCHING EXERCISES - Ankle Sprain, Acute Phase I, Weeks 1 to 2 These exercises may help you when beginning to restore flexibility in your ankle. You will likely work on these exercises for the 1 to 2 weeks after your injury. Once your physician, physical therapist, or athletic trainer sees adequate progress, he or she will advance your exercises. While completing these exercises, remember:   Restoring tissue flexibility helps normal motion to return to the joints. This allows healthier, less painful movement and activity.  An effective stretch should be held for at least 30 seconds.  A stretch should never be painful. You should only feel a gentle lengthening or release in  the stretched tissue. RANGE OF MOTION - Dorsi/Plantar Flexion  While sitting with your right / left knee straight, draw the top of your foot upwards by flexing your ankle. Then reverse the motion, pointing your toes downward.  Hold each position for __________ seconds.  After completing your first set of exercises, repeat this exercise with your knee bent. Repeat __________ times. Complete this exercise __________ times per day.  RANGE OF MOTION - Ankle Alphabet  Imagine your right / left big toe is a pen.  Keeping your hip and knee still, write out the entire alphabet with your "pen." Make the letters as large as you can without increasing any discomfort. Repeat __________ times. Complete this  exercise __________ times per day.  STRENGTHENING EXERCISES - Ankle Sprain, Acute -Phase I, Weeks 1 to 2 These exercises may help you when beginning to restore strength in your ankle. You will likely work on these exercises for 1 to 2 weeks after your injury. Once your physician, physical therapist, or athletic trainer sees adequate progress, he or she will advance your exercises. While completing these exercises, remember:   Muscles can gain both the endurance and the strength needed for everyday activities through controlled exercises.  Complete these exercises as instructed by your physician, physical therapist, or athletic trainer. Progress the resistance and repetitions only as guided.  You may experience muscle soreness or fatigue, but the pain or discomfort you are trying to eliminate should never worsen during these exercises. If this pain does worsen, stop and make certain you are following the directions exactly. If the pain is still present after adjustments, discontinue the exercise until you can discuss the trouble with your clinician. STRENGTH - Dorsiflexors  Secure a rubber exercise band/tubing to a fixed object (i.e., table, pole) and loop the other end around your right / left foot.  Sit on the floor facing the fixed object. The band/tubing should be slightly tense when your foot is relaxed.  Slowly draw your foot back toward you using your ankle and toes.  Hold this position for __________ seconds. Slowly release the tension in the band and return your foot to the starting position. Repeat __________ times. Complete this exercise __________ times per day.  STRENGTH - Plantar-flexors   Sit with your right / left leg extended. Holding onto both ends of a rubber exercise band/tubing, loop it around the ball of your foot. Keep a slight tension in the band.  Slowly push your toes away from you, pointing them downward.  Hold this position for __________ seconds. Return slowly,  controlling the tension in the band/tubing. Repeat __________ times. Complete this exercise __________ times per day.  STRENGTH - Ankle Eversion  Secure one end of a rubber exercise band/tubing to a fixed object (table, pole). Loop the other end around your foot just before your toes.  Place your fists between your knees. This will focus your strengthening at your ankle.  Drawing the band/tubing across your opposite foot, slowly, pull your little toe out and up. Make sure the band/tubing is positioned to resist the entire motion.  Hold this position for __________ seconds. Have your muscles resist the band/tubing as it slowly pulls your foot back to the starting position.  Repeat __________ times. Complete this exercise __________ times per day.  STRENGTH - Ankle Inversion  Secure one end of a rubber exercise band/tubing to a fixed object (table, pole). Loop the other end around your foot just before your toes.  Place your fists between  your knees. This will focus your strengthening at your ankle.  Slowly, pull your big toe up and in, making sure the band/tubing is positioned to resist the entire motion.  Hold this position for __________ seconds.  Have your muscles resist the band/tubing as it slowly pulls your foot back to the starting position. Repeat __________ times. Complete this exercises __________ times per day.  STRENGTH - Towel Curls  Sit in a chair positioned on a non-carpeted surface.  Place your right / left foot on a towel, keeping your heel on the floor.  Pull the towel toward your heel by only curling your toes. Keep your heel on the floor.  If instructed by your physician, physical therapist, or athletic trainer, add weight to the end of the towel. Repeat __________ times. Complete this exercise __________ times per day. Document Released: 10/10/2004 Document Revised: 07/26/2013 Document Reviewed: 06/23/2008 [employer] Patient Information 2015 [employer] [place]. This  information is not intended to replace advice given to you by your health care provider. Make sure you discuss any questions you have with your health care provider.    Fill in the above blanks the following way--repeat 5-10 times, twice daily, holding the exercise for 10-15 seconds.

## 2014-09-19 NOTE — Progress Notes (Signed)
Chief Complaint  Patient presents with  . ankle    ankle pain, swelling has gone down. painful to walk on it, sensentive to touch.    Patient presents for f/u on his right ankle pain. He has not started his new job, due to ongoing pain.  Up through yesterday, he still had significant pain. Today he notices it starting to improve, but it is still sore.  He is concerned about climbing 40 foot ladders at his new job due to the discomfort.  He may have some other job opportunities lined up (that don't require climbing ladders)  Swelling has improved some.  He took some ibuprofen at first, but it bothered his stomach so he stopped. He has been icing the right ankle.  It was too sensitive and painful to wrap it. He has been keeping it elevated at least 50% of the time.  He has not been doing any ROM exercises.  He has some discomfort posteriorly and anteriorly at the ankle when he walks. He is still a little tender at the bone laterally.  PMH, PSH ,SH reviewed, unchanged  Outpatient Encounter Prescriptions as of 09/19/2014  Medication Sig Note  . ergocalciferol (VITAMIN D2) 50000 UNITS capsule Take 1 capsule (50,000 Units total) by mouth once a week.   Marland Kitchen FLUoxetine (PROZAC) 40 MG capsule Take 40 mg by mouth daily. 09/12/2014: Received from: External Pharmacy  . levothyroxine (SYNTHROID, LEVOTHROID) 125 MCG tablet Take 1 tablet (125 mcg total) by mouth daily before breakfast. 09/12/2014: Admits to missing a pill or two once or twice a week  . Dexmethylphenidate HCl 40 MG CP24 Take 1 capsule by mouth daily.  01/13/2014: He hasn't taken this in about a month; not sure that he can tell a difference  . Multiple Vitamins-Minerals (ONE-A-DAY 50 PLUS PO) Take 1 tablet by mouth daily.   01/13/2014: Ran out months ago  . olanzapine-FLUoxetine (SYMBYAX) 6-25 MG per capsule Take 1 capsule by mouth every evening.     . [DISCONTINUED] Docusate Calcium (STOOL SOFTENER PO) Take 1 tablet by mouth daily.    No  facility-administered encounter medications on file as of 09/19/2014.   No Known Allergies  ROS:  No fever, chills, bleeding, bruising, numbness, tingling. Stomach pains resolved after stopping ibuprofen. No nausea, vomiting.  PHYSICAL EXAM: BP 130/78 mmHg  Pulse 84  Temp(Src) 98 F (36.7 C) (Oral)  Wt 199 lb 6.4 oz (90.447 kg) Well developed, pleasant male, in no distress. Gait is antalgic with some limping/favoring of the right ankle Very mild swelling at lateral ankle.  Tender at superior aspect of lateral malleolus, and anteriorly to this area of the bone.  It is very sensitive along the anterior portion of the lateral malleolus, and just in front of the bone.  He has pain with dorsiflexion, but no pain with plantar- or dorsiflexion against resistance. He has pain with inversion against resistance. No pain with eversion. No ankle instability. nontender at ATFL and inferior to malleolus, and no significant swelling in these areas Skin: no bruising or rash  ASSESSMENT/PLAN:  High ankle sprain of right lower extremity, subsequent encounter  Patient examined by Dr. Susann Givens as well. Discussed natural time course of healing, which may be slower than typical ankle sprain.  Given another week out of work, and to call for additional if still having pain next week. Reviewed ROM and strengthening exercises (start with ROM for the first 1-2 weeks). NSAIDs if tolerated--try Aleve BID, up to 2BID if tolerated.  Take Aleve 1-2 tablets twice daily with food over the next week. If it bothers your stomach, cut back to just 1 tablet at a time.  Do not use any ibuprofen, advil, Goody, BC or other pain medication at the same time.  Tylenol is fine to take with it, if both are needed for pain.  Do the home exercises as per the handout. Stay out of work for another week, maybe will need longer depending on how you feel. Let us know next week if you need an additional note.  Use an ace wrap or ankle  brace while walking/weight-bearing if it causes pain, or if you are swelling.  Don't use it if it causes more pain.

## 2014-11-19 ENCOUNTER — Other Ambulatory Visit: Payer: Self-pay | Admitting: Family Medicine

## 2014-11-21 NOTE — Telephone Encounter (Signed)
Call pt to see if he has insurance. If he doesn't, then needs TSH done and I can refill. If he has insurance, schedule fasting med check for the very near future, and refill to last until appointment (within a month)

## 2014-11-21 NOTE — Telephone Encounter (Signed)
Filled for 30 days worth of synthroid and he will call first thing in the morning to schedule.

## 2014-11-21 NOTE — Telephone Encounter (Signed)
Is this okay to refill? Looks like he may need appts. Please let me know which type of appts. Thanks.

## 2014-11-30 ENCOUNTER — Telehealth: Payer: Self-pay | Admitting: *Deleted

## 2014-11-30 NOTE — Telephone Encounter (Signed)
Deny.  Due for OV, as previously notified

## 2014-11-30 NOTE — Telephone Encounter (Signed)
Mail box full still

## 2014-11-30 NOTE — Telephone Encounter (Signed)
Mailbox full

## 2014-11-30 NOTE — Telephone Encounter (Signed)
Patient left message on my voicemail last Friday 11/25/14 asking for an rx for Viagra. (refilled his synthroid last week and he was supposed to call back and schedule appt-looks like he has not).

## 2014-12-01 NOTE — Telephone Encounter (Signed)
Patient called her today and left message on Tammy's voicemail to return his call. Tried to call yet again, mailbox full. Patient needs to schedule appt in the next 30 days for med check for thyroid and Dr.Knapp will also address request for Viagra at this visit.

## 2014-12-05 NOTE — Telephone Encounter (Signed)
Mailbox still full.

## 2014-12-15 ENCOUNTER — Ambulatory Visit: Payer: Self-pay | Admitting: Family Medicine

## 2014-12-16 ENCOUNTER — Ambulatory Visit (INDEPENDENT_AMBULATORY_CARE_PROVIDER_SITE_OTHER): Payer: Self-pay | Admitting: Family Medicine

## 2014-12-16 ENCOUNTER — Encounter: Payer: Self-pay | Admitting: Family Medicine

## 2014-12-16 VITALS — BP 140/80 | HR 96 | Temp 98.2°F | Wt 212.9 lb

## 2014-12-16 DIAGNOSIS — M546 Pain in thoracic spine: Secondary | ICD-10-CM

## 2014-12-16 MED ORDER — METHOCARBAMOL 500 MG PO TABS: 1000.0000 mg | ORAL_TABLET | Freq: Two times a day (BID) | ORAL | Status: DC | PRN

## 2014-12-16 NOTE — Patient Instructions (Signed)
Try taking either 800 mg of ibuprofen 3 times per day with food or 2 Aleve twice daily with food for the next 3 or 4 days and see if this helps. I will also prescribed a muscle relaxant that you can try for the next 2 or 3 days. Use heat to the area for 20 minutes at a time and try the stretches we discussed in the office.  Let us know if your pain is not improving.   Back Pain, Adult Low back pain is very common. About 1 in 5 people have back pain.The cause of low back pain is rarely dangerous. The pain often gets better over time.About half of people with a sudden onset of back pain feel better in just 2 weeks. About 8 in 10 people feel better by 6 weeks.  CAUSES Some common causes of back pain include:  Strain of the muscles or ligaments supporting the spine.  Wear and tear (degeneration) of the spinal discs.  Arthritis.  Direct injury to the back. DIAGNOSIS Most of the time, the direct cause of low back pain is not known.However, back pain can be treated effectively even when the exact cause of the pain is unknown.Answering your caregiver's questions about your overall health and symptoms is one of the most accurate ways to make sure the cause of your pain is not dangerous. If your caregiver needs more information, he or she may order lab work or imaging tests (X-rays or MRIs).However, even if imaging tests show changes in your back, this usually does not require surgery. HOME CARE INSTRUCTIONS For many people, back pain returns.Since low back pain is rarely dangerous, it is often a condition that people can learn to [facility] their own.   Remain active. It is stressful on the back to sit or stand in one place. Do not sit, drive, or stand in one place for more than 30 minutes at a time. Take short walks on level surfaces as soon as pain allows.Try to increase the length of time you walk each day.  Do not stay in bed.Resting more than 1 or 2 days can delay your recovery.  Do not  avoid exercise or work.Your body is made to move.It is not dangerous to be active, even though your back may hurt.Your back will likely heal faster if you return to being active before your pain is gone.  Pay attention to your body when you bend and lift. Many people have less discomfortwhen lifting if they bend their knees, keep the load close to their bodies,and avoid twisting. Often, the most comfortable positions are those that put less stress on your recovering back.  Find a comfortable position to sleep. Use a firm mattress and lie on your side with your knees slightly bent. If you lie on your back, put a pillow under your knees.  Only take over-the-counter or prescription medicines as directed by your caregiver. Over-the-counter medicines to reduce pain and inflammation are often the most helpful.Your caregiver may prescribe muscle relaxant drugs.These medicines help dull your pain so you can more quickly return to your normal activities and healthy exercise.  Put ice on the injured area.  Put ice in a plastic bag.  Place a towel between your skin and the bag.  Leave the ice on for 15-20 minutes, 03-04 times a day for the first 2 to 3 days. After that, ice and heat may be alternated to reduce pain and spasms.  Ask your caregiver about trying back exercises and  gentle massage. This may be of some benefit.  Avoid feeling anxious or stressed.Stress increases muscle tension and can worsen back pain.It is important to recognize when you are anxious or stressed and learn ways to manage it.Exercise is a great option. SEEK MEDICAL CARE IF:  You have pain that is not relieved with rest or medicine.  You have pain that does not improve in 1 week.  You have new symptoms.  You are generally not feeling well. SEEK IMMEDIATE MEDICAL CARE IF:   You have pain that radiates from your back into your legs.  You develop new bowel or bladder control problems.  You have unusual weakness  or numbness in your arms or legs.  You develop nausea or vomiting.  You develop abdominal pain.  You feel faint. Document Released: 03/11/2005 Document Revised: 09/10/2011 Document Reviewed: 07/13/2013 [facility] Patient Information 2015 [place], [place]. This information is not intended to replace advice given to you by your health care provider. Make sure you discuss any questions you have with your health care provider.

## 2014-12-16 NOTE — Progress Notes (Signed)
   Subjective:    Patient ID: Johnathan Estrada, male    DOB: 20-Dec-1955, 59 y.o.   MRN: [id number]  HPI He is here for thoracic back pain that started about a week ago after getting out of bed. He states he has been lifting heavy objects at work and has not been using good body mechanics. He describes the pain as a stiffness and sometimes a sharp pain with certain movements. The pain is bilateral and does not radiate. He has been taking 800 mg of Ibuprofen twice daily.  States today the pain is not a bad. No associated symptoms. States he is unable to reproduce the pain today.  Denies fever, chills, nausea, vomiting, diarrhea, cough. Denies history of back pain or surgery. There is no history of chronic steroid use. He is also asking questions regarding erectile dysfunction.   Reviewed allergies, medications, past medical history, past surgical history and social history.    Review of Systems Pertinent positives and negatives in the history of present illness    Objective:   Physical Exam  Constitutional: He is oriented to person, place, and time. He appears well-developed and well-nourished. No distress.  Neck: Normal range of motion. Neck supple.  Cardiovascular: Normal rate, regular rhythm and normal heart sounds.   Pulmonary/Chest: Effort normal and breath sounds normal.  Musculoskeletal: Normal range of motion.       Arms: Neurological: He is alert and oriented to person, place, and time. He has normal strength and normal reflexes. No cranial nerve deficit or sensory deficit. Gait normal.  Skin: Skin is warm and dry. No bruising, no ecchymosis and no rash noted. No erythema.          Assessment & Plan:  Bilateral thoracic back pain  Reassured him that his pain is musculoskeletal. Encouraged him to take NSAIDs and use the muscle relaxant as needed for severe pain. Also instructed him to apply heat 20 minutes and doing the stretches we discussed in the office. Discussed using  proper body mechanics. He will make an appointment with Dr. Lynelle Doctor to discuss some chronic conditions that are concerning him and I also made him aware that he is due for a physical exam.

## 2014-12-20 ENCOUNTER — Telehealth: Payer: Self-pay | Admitting: Family Medicine

## 2014-12-20 ENCOUNTER — Other Ambulatory Visit: Payer: Self-pay | Admitting: Family Medicine

## 2014-12-20 MED ORDER — METHOCARBAMOL 500 MG PO TABS: 1000.0000 mg | ORAL_TABLET | Freq: Two times a day (BID) | ORAL | Status: DC | PRN

## 2014-12-20 NOTE — Telephone Encounter (Signed)
Tried to call pt but voicemail box was full and unable to leave a message

## 2014-12-20 NOTE — Telephone Encounter (Signed)
Spoke with patient and if he is not better he will make appt with Dr. Lynelle Doctor. Pt is going today to get some ibuprofen and he is doing the heat as well

## 2014-12-20 NOTE — Telephone Encounter (Signed)
I will give him a couple more days worth of muscle relaxer but he should be improving gradually and not need this medication long term. He will need to follow up with Dr. Lynelle Doctor if he needs any more.  Please ask him if he is using heat and stretching like we discussed at his visit. Is he taking an antiinflammatory? We discussed all this at his appointment. Thanks.

## 2014-12-20 NOTE — Telephone Encounter (Signed)
Please call Wants refill on generic Robaxin   It helps a lot. Pain is a 10 on a scale of 1-10   States he has 4 pills left, 2 for tonight and 2 for tomorrow morning

## 2014-12-23 ENCOUNTER — Telehealth: Payer: Self-pay | Admitting: Family Medicine

## 2014-12-23 ENCOUNTER — Other Ambulatory Visit: Payer: Self-pay | Admitting: Family Medicine

## 2014-12-23 ENCOUNTER — Telehealth: Payer: Self-pay

## 2014-12-23 MED ORDER — METHOCARBAMOL 500 MG PO TABS: 1000.0000 mg | ORAL_TABLET | Freq: Two times a day (BID) | ORAL | Status: DC | PRN

## 2014-12-23 NOTE — Telephone Encounter (Signed)
error 

## 2014-12-23 NOTE — Progress Notes (Signed)
Patient called requesting additional Robaxin for back pain to get him through until he can follow up with Dr. Lynelle Doctor next week. Medication sent to pharmacy.

## 2014-12-23 NOTE — Telephone Encounter (Signed)
Please call him and let him know that I will send in more Robaxin to his pharmacy and then he will need to follow up with Dr. Lynelle Doctor. Thanks.

## 2014-12-23 NOTE — Telephone Encounter (Signed)
Pt called and was still having back pain, he made a appt with dr Lynelle Doctor on tuesday, said the pain is still very bad, he was wanting to know if there was anyway he could get some more Robaxin said he has taking one and is afraid he want have enough to last him till Tuesday said you gave him 6 the 27th , he is taking ibuprofen to but the pain is till very bad, pt can be reached at [phone] and [facility] [address].

## 2014-12-23 NOTE — Telephone Encounter (Signed)
Talked to pt and he is aware of the prescription you sent him and to follow up with Lynelle Doctor on Tuesday

## 2014-12-23 NOTE — Telephone Encounter (Signed)
Called pt twice. They do not have a voicemail so I couldn't leave a message. I will keep trying to call

## 2014-12-24 ENCOUNTER — Other Ambulatory Visit: Payer: Self-pay | Admitting: Family Medicine

## 2014-12-27 ENCOUNTER — Ambulatory Visit (INDEPENDENT_AMBULATORY_CARE_PROVIDER_SITE_OTHER): Payer: Self-pay | Admitting: Family Medicine

## 2014-12-27 ENCOUNTER — Encounter: Payer: Self-pay | Admitting: Family Medicine

## 2014-12-27 VITALS — BP 130/88 | HR 76 | Ht 71.0 in | Wt 211.0 lb

## 2014-12-27 DIAGNOSIS — K219 Gastro-esophageal reflux disease without esophagitis: Secondary | ICD-10-CM

## 2014-12-27 DIAGNOSIS — E039 Hypothyroidism, unspecified: Secondary | ICD-10-CM

## 2014-12-27 DIAGNOSIS — M62830 Muscle spasm of back: Secondary | ICD-10-CM

## 2014-12-27 MED ORDER — METHOCARBAMOL 500 MG PO TABS: 1000.0000 mg | ORAL_TABLET | Freq: Two times a day (BID) | ORAL | Status: DC | PRN

## 2014-12-27 NOTE — Progress Notes (Signed)
Chief Complaint  Patient presents with  . Follow-up    on back pain only. Will schedule med check upon checkout today.   . Erectile Dysfunction    asking if he is a candidate for either Viagra or Cialis-I told him that you would probably be better to address this at upcoming med check.    9/23 he saw Vickie for back pain.  It had started the week prior, related to some heavy lifting at work (with poor body mechanics).  He was to continue with  ibuprofen, and was prescribed robaxin for muscle spasm.  Today is the best day pain-wise. Prior to today, from 9/23 until yesterday, his pain was 10/10, "crippling".  Currently just 1-2/10. He took his last of the robaxin, requesting refill. Pain is at the inferior portion of the right shoulder blade, but sometimes radiates across to the whole back.  Denies radiation of the pain.   Sometimes has some pain at right groin, thinks it might be a hernia (stabbing pain with cough, slight bulge), but no other abdominal pain. No other radiation of pain, no numbness, tingling.  He complains of some burning sensation in his upper stomach, which radiates into his throat  He has symptoms daily, not sure if related to eating. +high stress, related to caring for his mother.  He admits noncompliance with all of his medications.  He reports that he has a full bottle of levothyroxine, but hasn't been taking it. Hasn't been taking any psych meds.  Outpatient Encounter Prescriptions as of 12/27/2014  Medication Sig Note  . ibuprofen (ADVIL,MOTRIN) 200 MG tablet Take 800 mg by mouth every 6 (six) hours as needed. 12/27/2014: Taking 4 pills BID for back pain  . methocarbamol (ROBAXIN) 500 MG tablet Take 2 tablets (1,000 mg total) by mouth 2 (two) times daily as needed for muscle spasms.   Marland Kitchen Dexmethylphenidate HCl 40 MG CP24 Take 1 capsule by mouth daily.  01/13/2014: He hasn't taken this in about a month; not sure that he can tell a difference  . ergocalciferol (VITAMIN  D2) 50000 UNITS capsule Take 1 capsule (50,000 Units total) by mouth once a week. (Patient not taking: Reported on 12/16/2014)   . FLUoxetine (PROZAC) 40 MG capsule Take 40 mg by mouth daily. 09/12/2014: Received from: External Pharmacy  . levothyroxine (SYNTHROID, LEVOTHROID) 125 MCG tablet TAKE 1 TABLET (125 MCG TOTAL) BY MOUTH DAILY BEFORE BREAKFAST. (Patient not taking: Reported on 12/16/2014)   . Multiple Vitamins-Minerals (ONE-A-DAY 50 PLUS PO) Take 1 tablet by mouth daily.   01/13/2014: Ran out months ago  . olanzapine-FLUoxetine (SYMBYAX) 6-25 MG per capsule Take 1 capsule by mouth every evening.      No facility-administered encounter medications on file as of 12/27/2014.   No Known Allergies  PMH, PSH SH reviewed. ROS: Still has some ankle soreness from prior injury. +ED--he has a girlfriend, and is afraid she won't stay with him due to this problem. He denies fever, chills, urinary complaints or other issues except as noted in HPI.  PHYSICAL EXAM:  BP 130/88 mmHg  Pulse 76  Ht  (1.803 m)  Wt 211 lb (95.709 kg)  BMI 29.44 kg/m2  Well developed male, in no distress Neck: no lymphadenopathy or mass Heart: regular rate and rhythm Lungs: clear Back: area of pain is at inferior portion of right scapula, in mid-portion and extends medially.  Nontender currently over this area, no palpable spasm. Neuro: alert and oriented. Normal strength, sensation Skin: intact, no  rashes Abdomen: soft, nontender, no mass Chest: nontender to palpation  ASSESSMENT/PLAN:  Muscle spasm of back - improved; continue robaxin prn. ibuprofen dosing reviewed (cut back if GI side effects); heat, massage - Plan: methocarbamol (ROBAXIN) 500 MG tablet  Gastroesophageal reflux disease without esophagitis - no epigastric tenderness.  reviewed diet, OTC PPI meds.  Hypothyroidism, unspecified hypothyroidism type - noncompliant--encouraged to restart meds and f/u in 6 weeks for med check with  labs    Your severe back pain is related to muscle spasm.  Given that you are not having pain currently, and that you are having some stomach problems, don't use the ibuprofen today if isn't needed.  You may take up to  (4 tablets) up to three times daily, with foods, if needed. If you are taking it, you might want to take a Prilosec OTC along with it, to help protect your stomach. It sounds like your stomach/chest symptoms might be more related to reflux, given that your exam was normal (no tenderness today).  Read the handout to see which foods you should avoid, to try and prevent problems with reflux.  Given that your pain is mostly muscle spasm, it is important to avoid a lot of twisting. Use your larger core muscles and leg muscles rather than leaning, twisting. Bend from your knees, not from your waist.  Use heat and massage when needed.  Use the muscle relaxants as needed.  Restart taking your thyroid medication every day.  I'd like for you to be taking this for at least 6 weeks before your med check, otherwise we can't do blood test while you are here.   ED--not addressed today, as this was an acute visit. He can return for another visit to discuss in more detail, discuss factors that contribute, and medications Did mention that if his girlfriend would break up with him over this, perhaps he should reconsider this relationship... To be discussed at either med check, or sooner separate visit if patient prefers.

## 2014-12-27 NOTE — Patient Instructions (Signed)
  Your severe back pain is related to muscle spasm.  Given that you are not having pain currently, and that you are having some stomach problems, don't use the ibuprofen today if isn't needed.  You may take up to  (4 tablets) up to three times daily, with foods, if needed. If you are taking it, you might want to take a Prilosec OTC along with it, to help protect your stomach. It sounds like your stomach/chest symptoms might be more related to reflux, given that your exam was normal (no tenderness today).  Read the handout to see which foods you should avoid, to try and prevent problems with reflux.  Given that your pain is mostly muscle spasm, it is important to avoid a lot of twisting. Use your larger core muscles and leg muscles rather than leaning, twisting. Bend from your knees, not from your waist.  Use heat and massage when needed.  Use the muscle relaxants as needed.  Restart taking your thyroid medication every day.  I'd like for you to be taking this for at least 6 weeks before your med check, otherwise we can't do blood test while you are here.   Food Choices for Gastroesophageal Reflux Disease When you have gastroesophageal reflux disease (GERD), the foods you eat and your eating habits are very important. Choosing the right foods can help ease the discomfort of GERD. WHAT GENERAL GUIDELINES DO I NEED TO FOLLOW?  Choose fruits, vegetables, whole grains, low-fat dairy products, and low-fat meat, fish, and poultry.  Limit fats such as oils, salad dressings, butter, nuts, and avocado.  Keep a food diary to identify foods that cause symptoms.  Avoid foods that cause reflux. These may be different for different people.  Eat frequent small meals instead of three large meals each day.  Eat your meals slowly, in a relaxed setting.  Limit fried foods.  Cook foods using methods other than frying.  Avoid drinking alcohol.  Avoid drinking large amounts of liquids with your  meals.  Avoid bending over or lying down until 2-3 hours after eating. WHAT FOODS ARE NOT RECOMMENDED? The following are some foods and drinks that may worsen your symptoms: Vegetables Tomatoes. Tomato juice. Tomato and spaghetti sauce. Chili peppers. Onion and garlic. Horseradish. Fruits Oranges, grapefruit, and lemon (fruit and juice). Meats High-fat meats, fish, and poultry. This includes hot dogs, ribs, ham, sausage, salami, and bacon. Dairy Whole milk and chocolate milk. Sour cream. Cream. Butter. Ice cream. Cream cheese.  Beverages Coffee and tea, with or without caffeine. Carbonated beverages or energy drinks. Condiments Hot sauce. Barbecue sauce.  Sweets/Desserts Chocolate and cocoa. Donuts. Peppermint and spearmint. Fats and Oils High-fat foods, including [place] fries and potato chips. Other Vinegar. Strong spices, such as black pepper, white pepper, red pepper, cayenne, curry powder, cloves, ginger, and chili powder. The items listed above may not be a complete list of foods and beverages to avoid. Contact your dietitian for more information. Document Released: 03/11/2005 Document Revised: 03/16/2013 Document Reviewed: 01/13/2013 [facility] Patient Information 2015 [place], [place]. This information is not intended to replace advice given to you by your health care provider. Make sure you discuss any questions you have with your health care provider.

## 2015-03-27 ENCOUNTER — Other Ambulatory Visit: Payer: Self-pay | Admitting: Family Medicine

## 2015-05-02 ENCOUNTER — Other Ambulatory Visit: Payer: Self-pay | Admitting: Family Medicine

## 2015-05-02 NOTE — Telephone Encounter (Signed)
Tried to call pt but mailbox was full and could not leave a message.   PT IS DUE FOR A MED CHECK. WILL REFILL ONLY FOR 30 days

## 2015-05-24 DIAGNOSIS — E119 Type 2 diabetes mellitus without complications: Secondary | ICD-10-CM

## 2015-05-24 HISTORY — DX: Type 2 diabetes mellitus without complications: E11.9

## 2015-06-03 ENCOUNTER — Other Ambulatory Visit: Payer: Self-pay | Admitting: Family Medicine

## 2015-06-14 ENCOUNTER — Ambulatory Visit (INDEPENDENT_AMBULATORY_CARE_PROVIDER_SITE_OTHER): Payer: Managed Care, Other (non HMO) | Admitting: Family Medicine

## 2015-06-14 ENCOUNTER — Encounter: Payer: Self-pay | Admitting: Family Medicine

## 2015-06-14 VITALS — BP 150/90 | HR 88 | Temp 98.2°F | Ht 71.0 in | Wt 194.6 lb

## 2015-06-14 DIAGNOSIS — E119 Type 2 diabetes mellitus without complications: Secondary | ICD-10-CM | POA: Diagnosis not present

## 2015-06-14 DIAGNOSIS — R5383 Other fatigue: Secondary | ICD-10-CM

## 2015-06-14 DIAGNOSIS — E039 Hypothyroidism, unspecified: Secondary | ICD-10-CM | POA: Diagnosis not present

## 2015-06-14 DIAGNOSIS — K219 Gastro-esophageal reflux disease without esophagitis: Secondary | ICD-10-CM | POA: Diagnosis not present

## 2015-06-14 DIAGNOSIS — E1165 Type 2 diabetes mellitus with hyperglycemia: Secondary | ICD-10-CM | POA: Diagnosis not present

## 2015-06-14 DIAGNOSIS — E559 Vitamin D deficiency, unspecified: Secondary | ICD-10-CM

## 2015-06-14 LAB — CBC WITH DIFFERENTIAL/PLATELET
Basophils Absolute: 0.1 10*3/uL (ref 0.0–0.1)
Basophils Relative: 1 % (ref 0–1)
Eosinophils Absolute: 0.1 10*3/uL (ref 0.0–0.7)
Eosinophils Relative: 2 % (ref 0–5)
HCT: 43.1 % (ref 39.0–52.0)
Hemoglobin: 14.9 g/dL (ref 13.0–17.0)
Lymphocytes Relative: 21 % (ref 12–46)
Lymphs Abs: 1.3 10*3/uL (ref 0.7–4.0)
MCH: 31 pg (ref 26.0–34.0)
MCHC: 34.6 g/dL (ref 30.0–36.0)
MCV: 89.8 fL (ref 78.0–100.0)
MPV: 9.6 fL (ref 8.6–12.4)
Monocytes Absolute: 0.6 10*3/uL (ref 0.1–1.0)
Monocytes Relative: 9 % (ref 3–12)
Neutro Abs: 4.3 10*3/uL (ref 1.7–7.7)
Neutrophils Relative %: 67 % (ref 43–77)
Platelets: 264 10*3/uL (ref 150–400)
RBC: 4.8 MIL/uL (ref 4.22–5.81)
RDW: 13.3 % (ref 11.5–15.5)
WBC: 6.4 10*3/uL (ref 4.0–10.5)

## 2015-06-14 LAB — POCT URINALYSIS DIPSTICK
Bilirubin, UA: NEGATIVE
Ketones, UA: NEGATIVE
Leukocytes, UA: NEGATIVE
NITRITE UA: NEGATIVE
PH UA: 5.5
Protein, UA: NEGATIVE
RBC UA: NEGATIVE
SPEC GRAV UA: 1.015
UROBILINOGEN UA: NEGATIVE

## 2015-06-14 LAB — COMPREHENSIVE METABOLIC PANEL
ALT: 23 U/L (ref 9–46)
AST: 16 U/L (ref 10–35)
Albumin: 4.5 g/dL (ref 3.6–5.1)
Alkaline Phosphatase: 159 U/L — ABNORMAL HIGH (ref 40–115)
BUN: 17 mg/dL (ref 7–25)
CO2: 27 mmol/L (ref 20–31)
Calcium: 9.4 mg/dL (ref 8.6–10.3)
Chloride: 92 mmol/L — ABNORMAL LOW (ref 98–110)
Creat: 1.19 mg/dL (ref 0.70–1.33)
Glucose, Bld: 536 mg/dL (ref 65–99)
Potassium: 4.7 mmol/L (ref 3.5–5.3)
Sodium: 129 mmol/L — ABNORMAL LOW (ref 135–146)
Total Bilirubin: 0.7 mg/dL (ref 0.2–1.2)
Total Protein: 7.4 g/dL (ref 6.1–8.1)

## 2015-06-14 LAB — POCT GLYCOSYLATED HEMOGLOBIN (HGB A1C): HEMOGLOBIN A1C: 14

## 2015-06-14 LAB — GLUCOSE, POCT (MANUAL RESULT ENTRY): POC GLUCOSE: 580 mg/dL — AB (ref 70–99)

## 2015-06-14 LAB — TSH: TSH: 67.1 mIU/L — ABNORMAL HIGH (ref 0.40–4.50)

## 2015-06-14 MED ORDER — INSULIN PEN NEEDLE 32G X 4 MM MISC: 1.0000 | Freq: Two times a day (BID) | Status: DC

## 2015-06-14 MED ORDER — BASAGLAR KWIKPEN 100 UNIT/ML ~~LOC~~ SOPN: PEN_INJECTOR | SUBCUTANEOUS | Status: DC

## 2015-06-14 MED ORDER — ONETOUCH ULTRASOFT LANCETS MISC: Status: DC

## 2015-06-14 MED ORDER — GLUCOSE BLOOD VI STRP: ORAL_STRIP | Status: DC

## 2015-06-14 NOTE — Progress Notes (Signed)
Chief Complaint  Patient presents with  . Advice Only    having bladder issues, has a burning in his stomach and throat, hiccups a lot-zantac helps. Tastebuds are off and very thirsty.    He is complaining of a burning in his lower chest which can radiate up into his throat.  He is taking OTC ranitidine once daily--unsure of dose--and that seems to help a lot. Usually takes it at night (works 3rd shift).  Burning in the stomach x 2-3 months.  Taste change only occurred 1-2 weeks ago. Diet changed, as he can't taste food the same way, so no longer eating [place], pizza.  "food doesn't taste right". Things taste bad, and the texture feeling in his mouth seems different. Denies it being just LESS taste.  Was unable to eat a slice of pizza last night. +caffeine--some soda, but much less often due to the taste change Eating ice cream, grits, eggs, milk, as these things don't seem to bother him as much.  He has also been drinking a lot of lemonade.  He is reporting losing control of his bladder--complaining of urinary urgency.  He has had some incontinence (dribbling on the way to the bathroom).  Denies any dysuria, abdominal pain (just the burning as above), blood in the urine, odor to the urine.  He is drinking a lot of water, so urine is light in color. +dry mouth, excessive thirst. He reports the tip/top of his tongue feels dry. Symptoms x 1-2 weeks.  Medications (psych) are unchanged.  H/o vitamin D deficiency--he admits that he never filled the prescription, and hasn't been taking an OTC supplement recently/regularly either.  Hypotyroidism: long history of noncompliance with treatment.  He has been off his thyroid medication for 6 months (had trouble figuring out when to take it, in relation to food, so just stopped taking it). He reports still having a bottle left at home.  He reports that he has insurance now, but couldn't find the card today.  Watches his 36yo mother with alzheimers, and  reports having a lot of stress related to this and his job.  PMH, PSH, SH reviewed and updated.  Outpatient Encounter Prescriptions as of 60/22/2017  Medication Sig Note  . ALPRAZolam (XANAX) 0.5 MG tablet TAKE 1 TABLET 3 TIMES A DAY AS NEEDED FOR ANXIETY 3/60/2017: Using once a day  . OLANZapine-FLUoxetine (SYMBYAX) 12-50 MG capsule Take 1 capsule by mouth daily. 60/22/2017: Received from: External Pharmacy  . [DISCONTINUED] Multiple Vitamins-Minerals (ONE-A-DAY 50 PLUS PO) Take 1 tablet by mouth daily.   60/22/2015: Ran out months ago  . ergocalciferol (VITAMIN D2) 50000 UNITS capsule Take 1 capsule (50,000 Units total) by mouth once a week. (Patient not taking: Reported on 12/16/2014) 60/22/2017: He admits he never filled this prescription; not taking any OTC vitamin D currently either  . ibuprofen (ADVIL,MOTRIN) 200 MG tablet Take 800 mg by mouth every 6 (six) hours as needed. Reported on 06/14/2015 06/14/2015: Uses prn, infrequently  . levothyroxine (SYNTHROID, LEVOTHROID) 125 MCG tablet TAKE 1 TABLET (125 MCG TOTAL) BY MOUTH DAILY BEFORE BREAKFAST. (Patient not taking: Reported on 06/14/2015) 60/22/2017: Stopped taking about 6 months ago  . [DISCONTINUED] Dexmethylphenidate HCl 40 MG CP24 Take 1 capsule by mouth daily. Reported on 06/14/2015 60/22/2015: He hasn't taken this in about a month; not sure that he can tell a difference  . [DISCONTINUED] FLUoxetine (PROZAC) 40 MG capsule Take 40 mg by mouth daily. 60/20/2016: Received from: External Pharmacy  . [DISCONTINUED] methocarbamol (ROBAXIN) 500  MG tablet Take 2 tablets (1,000 mg total) by mouth 2 (two) times daily as needed for muscle spasms.   . [DISCONTINUED] olanzapine-FLUoxetine (SYMBYAX) 6-25 MG per capsule Take 1 capsule by mouth every evening.      No facility-administered encounter medications on file as of 06/14/2015.   He is also taking Adderall daily from Dr. Jerry Caras know the dose.  No Known Allergies  ROS: no fever, chills, URI  symptoms, cough, shortness of breath. +vision changes, not seeing as well with his glasses recently.  Denies tingling in hands/feet.  Denies allergy symptoms, sinus problems. No chest pain, just the burning as noted above. No bleeding, bruising, rash/skin lesions. +depression.  See HPI.  PHYSICAL EXAM: BP 150/90 mmHg  Pulse 88  Temp(Src) 98.2 F (36.8 C) (Tympanic)  Ht '5\' 11"'  (1.803 m)  Wt 194 lb 9.6 oz (88.27 kg)  BMI 27.15 kg/m2  Somewhat anxious appearing male, in no distress HEENT: PERRL, EOMI, conjunctiva and sclera are clear. OP is clear--moist mucus membranes, no erythema or lesions noted. Sinuses nontender Neck: no lymphadenopathy or mass Heart: regular rate and rhythm without murmur Lungs: clear bilaterally Abdomen: soft, nontender, no mass Extremities: no edema, 2+ pulses Psych: anxious, flat affect.  Neuro: alert and oriented, normal cranial nerves, gait.  nonfasting glucose 580, A1c 14 (nonfasting today--had grits about 4 hours prior)  Urine dip: 3+ glucose.  No ketones.  ASSESSMENT/PLAN:  Diabetes mellitus, new onset ([place]) - discussed dx, complications, importance of compliance. Start Basaglar 10U. shown how to inject and given glucometer - Plan: Comprehensive metabolic panel, Amb ref to Medical Nutrition Therapy-MNT, Glucose (CBG), HgB A1c, POCT Urinalysis Dipstick  Vitamin D deficiency - noncompliant; discussed reasons to treat. Will advise when results available (prefers daily OTC, not rx) - Plan: VITAMIN D 25 Hydroxy (Vit-D Deficiency, Fractures)  Hypothyroidism, unspecified hypothyroidism type - noncompliant. restart meds (has bottle) and discussed proper use - Plan: TSH  Other fatigue - Plan: CBC with Differential/Platelet  Uncontrolled type 2 diabetes mellitus with hyperglycemia, without long-term current use of insulin ([facility]) - Plan: Comprehensive metabolic panel, Insulin Glargine (BASAGLAR KWIKPEN) 100 UNIT/ML SOPN, POCT Urinalysis  Dipstick  Gastroesophageal reflux disease without esophagitis - start Prilosec OTC daily. if ineffective, change to Rx PPI.    c-met, CBC, TSH, Vit D  Instructed on how to check sugars, given glucometer. Instructed on how to use insulin pen. Stay at 10 U until seen on Monday; after reviewing list of sugars, will then educate on how to titrate up the dose. Await chem results prior to starting metformin--plan to start if Cr normal.  Will need diabetic foot exam at f/u;  Recheck BP at f/u, along with urine microalbumin. Anxious today Will need ACEI or ARB started Referred for DM education  Today's visit was over an hour, at least 40-45 min face to face with MD, more than 1/2 spent counseling and answering questions.  F/u Monday     Restart your thyroid medication. Ideally it should be taken on an empty stomach, separate from other medications (by an hour). If you aren't taking vitamins, and having trouble taking it separately from other medications, then go ahead and take it with your psychiatric medications (as long as it is separated from food).  It is better to get some in your system (not as well absorbed it taken improperly), but better than getting nothing.  I also recommend taking Vitamin D daily--Once I get the test results back, I can let you know how much.  The normal recommendation is 1000 IU every day.  If yours is very low, as I suspect, and if you don't want to take a weekly prescription, then you should be taking 5000 IU every day for at least 3 months.  We will let you know your results in 1-2 days.  Take Prilosec OTC 42m once daily (generic omeprazole is fine).  Do this in place of the ranitidine.  If not helping with your reflux/burning symptoms, call [place] strength to be sent to you pharmacy.  We are referring you to diabetes education. We are starting you on insulin to take once daily. Take 10 units as shown. We will start increasing this on  Monday--usually increasing by 3 units every 3-4 days as your fasting sugars remain >160. You will also be started on oral medication, but I need to make sure that your kidneys are okay before prescribing it (we haven't checked this in over a year, and I don't want to give you a medication that can be harmful).  We will be in touch tomorrow with your lab results.  Return next week.

## 2015-06-14 NOTE — Patient Instructions (Addendum)
Restart your thyroid medication. Ideally it should be taken on an empty stomach, separate from other medications (by an hour). If you aren't taking vitamins, and having trouble taking it separately from other medications, then go ahead and take it with your psychiatric medications (as long as it is separated from food).  It is better to get some in your system (not as well absorbed it taken improperly), but better than getting nothing.  I also recommend taking Vitamin D daily--Once I get the test results back, I can let you know how much.  The normal recommendation is 1000 IU every day.  If yours is very low, as I suspect, and if you don't want to take a weekly prescription, then you should be taking 5000 IU every day for at least 3 months.  We will let you know your results in 1-2 days.  Take Prilosec OTC  once daily (generic omeprazole is fine).  Do this in place of the ranitidine.  If not helping with your reflux/burning symptoms, call us for a prescription strength to be sent to you pharmacy.  We are referring you to diabetes education. We are starting you on insulin to take once daily. Take 10 units as shown. We will start increasing this on Monday--usually increasing by 3 units every 3-4 days as your fasting sugars remain >160. You will also be started on oral medication, but I need to make sure that your kidneys are okay before prescribing it (we haven't checked this in over a year, and I don't want to give you a medication that can be harmful).  We will be in touch tomorrow with your lab results.  Return next week.   How to Avoid Diabetes Problems You can do a lot to prevent or slow down diabetes problems. Following your diabetes plan and taking care of yourself can reduce your risk of serious or life-threatening complications. Below, you will find certain things you can do to prevent diabetes problems. MANAGE YOUR DIABETES Follow your health care provider's, nurse educator's, and  dietitian's instructions for managing your diabetes. They will teach you the basics of diabetes care. They can help answer questions you may have. Learn about diabetes and make healthy choices regarding eating and physical activity. Monitor your blood glucose level regularly. Your health care provider will help you decide how often to check your blood glucose level depending on your treatment goals and how well you are meeting them.  DO NOT USE NICOTINE Nicotine and diabetes are a dangerous combination. Nicotine raises your risk for diabetes problems. If you quit using nicotine, you will lower your risk for heart attack, stroke, nerve disease, and kidney disease. Your cholesterol and your blood pressure levels may improve. Your blood circulation will also improve. Do not use any tobacco products, including cigarettes, chewing tobacco, or electronic cigarettes. If you need help quitting, ask your health care provider. KEEP YOUR BLOOD PRESSURE UNDER CONTROL Your health care provider will determine your individualized target blood pressure based on your age, your medicines, how long you have had diabetes, and any other medical conditions you have. Blood pressure consists of two numbers. Generally, the goal is to keep your top number (systolic pressure) at or below 130, and your bottom number (diastolic pressure) at or below 80. Your health care provider may recommend a lower target blood pressure reading, if appropriate. Meal planning, medicines, and exercise can help you reach your target blood pressure. Make sure your health care provider checks your blood pressure at  every visit. KEEP YOUR CHOLESTEROL UNDER CONTROL Normal cholesterol levels will help prevent heart disease and stroke. These are the biggest health problems for people with diabetes. Keeping cholesterol levels under control can also help with blood flow. Have your cholesterol level checked at least once a year. Your health care provider may  prescribe a medicine known as a statin. Statins lower your cholesterol. If you are not taking a statin, ask your health care provider if you should be. Meal planning, exercise, and medicines can help you reach your cholesterol targets.  SCHEDULE AND KEEP YOUR ANNUAL PHYSICAL EXAMS AND EYE EXAMS Your health care provider will tell you how often he or she wants to see you depending on your plan of treatment. It is important that you keep these appointments so that possible problems can be identified early and complications can be avoided or treated.  Every visit with your health care provider should include your weight, blood pressure, and an evaluation of your blood glucose control.  Your hemoglobin A1c should be checked:  At least twice a year if you are at your goal.  Every 3 months if there are changes in treatment.  If you are not meeting your goals.  Your blood lipids should be checked yearly. You should also be checked yearly to see if you have protein in your urine (microalbumin).  Schedule a dilated eye exam within 5 years of your diagnosis if you have type 1 diabetes, and then yearly. Schedule a dilated eye exam at diagnosis if you have type 2 diabetes, and then yearly. All exams thereafter can be extended to every 2 to 3 years if one or more exams have been normal. KEEP YOUR VACCINES CURRENT It is recommended that you receive a flu (influenza) vaccine every year. It is also recommended that you receive a pneumonia (pneumococcal) vaccine. If you are 60 years of age or older and have never received a pneumonia vaccine, this vaccine may be given as a series of two separate shots. Ask your health care provider which additional vaccines may be recommended. TAKE CARE OF YOUR FEET  Diabetes may cause you to have a poor blood supply (circulation) to your legs and feet. Because of this, the skin may be thinner, break easier, and heal more slowly. You also may have nerve damage in your legs and  feet, causing decreased feeling. You may not notice minor injuries to your feet that could lead to serious problems or infections. Taking care of your feet is very important. Visual foot exams are performed at every routine medical visit. The exams check for cuts, injuries, or other problems with the feet. A comprehensive foot exam should be done yearly. This includes visual inspection as well as assessing foot pulses and testing for loss of sensation. You should also do the following:  Inspect your feet daily for cuts, calluses, blisters, ingrown toenails, and signs of infection, such as redness, swelling, or pus.  Wash and dry your feet thoroughly, especially between the toes.  Avoid soaking your feet regularly in hot water baths.  Moisturize dry skin with lotion, avoiding areas between your toes.  Cut toenails straight across and file the edges.  Avoid shoes that do not fit well or have areas that irritate your skin.  Avoid going barefooted or wearing only socks. Your feet need protection. TAKE CARE OF YOUR TEETH People with poorly controlled diabetes are more likely to have gum (periodontal) disease. These infections make diabetes harder to control. Periodontal diseases, if  left untreated, can lead to tooth loss. Brush your teeth twice a day, floss, and see your dentist for checkups and cleaning every 6 months, or 2 times a year. ASK YOUR HEALTH CARE PROVIDER ABOUT TAKING ASPIRIN Taking aspirin daily is recommended to help prevent cardiovascular disease in people with and without diabetes. Ask your health care provider if this would benefit you and what dose he or she would recommend. DRINK RESPONSIBLY Moderate amounts of alcohol (less than 1 drink per day for adult women and less than 2 drinks per day for adult men) have a minimal effect on blood glucose if ingested with food. It is important to eat food with alcohol to avoid hypoglycemia. People should avoid alcohol if they have a history  of alcohol abuse or dependence, if they are pregnant, and if they have liver disease, pancreatitis, advanced neuropathy, or severe hypertriglyceridemia. LESSEN STRESS Living with diabetes can be stressful. When you are under stress, your blood glucose may be affected in two ways:  Stress hormones may cause your blood glucose to rise.  You may be distracted from taking good care of yourself. It is a good idea to be aware of your stress level and make changes that are necessary to help you better manage challenging situations. Support groups, planned relaxation, a hobby you enjoy, meditation, healthy relationships, and exercise all work to lower your stress level. If your efforts do not seem to be helping, get help from your health care provider or a trained mental health professional.   This information is not intended to replace advice given to you by your health care provider. Make sure you discuss any questions you have with your health care provider.   Document Released: 11/27/2010 Document Revised: 04/01/2014 Document Reviewed: 05/05/2013 Elsevier Interactive Patient Education 2016 [employer]  How and Where to Give Subcutaneous Insulin Injections, Adult People with type 1 diabetes must take insulin since their bodies do not make it. People with type 2 diabetes may require insulin. There are many different types of insulin as well as other injectable diabetes medicines that are meant to be injected into the fat layer under your skin. The type of insulin or injectable diabetes medicine you take may determine how many injections you give yourself and when to take the injections.  CHOOSING A SITE FOR INJECTION Insulin absorption varies from site to site. As with any injectable medication it is best for the insulin to be injected within the same body region. However, do not inject the insulin in the same spot each time. Rotating the spots you give your injections will prevent inflammation or  tissue breakdown. There are four main regions that can be used for injections. The regions include the:  Abdomen (preferred region, especially for non-insulin injectable diabetes medicine).  Front and upper outer sides of thighs.  Back of upper arm.  Buttocks. USING A SYRINGE AND VIAL Drawing up insulin: single insulin dose  Wash your hands with soap and water.  Gently roll the insulin bottle (vial) between your hands to mix it. Do not shake the vial.  Clean the top rubber part of the vial with an alcohol wipe. Be sure that the plastic pop-top has been removed on newer vials.  Remove the plastic cover from the needle on the syringe. Do not let the needle touch anything.  Pull the plunger back to draw air into the syringe. The air should be the same amount as the insulin dose.  Push the needle through the rubber  on the top of the vial. Do not turn the vial over.  Push the plunger in all the way to put the air into the vial.  Leave the needle in the vial and turn the vial and syringe upside down.  Pull down slowly on the plunger, drawing the amount of insulin you need into the syringe.  Look for air bubbles in the syringe. You may need to push the plunger up and down 2 to 3 times to slowly get rid of any air bubbles in the syringe.  Pull back the plunger to get your correct dose.  Remove the needle from the vial.  Use an alcohol wipe to clean the area of the body to be injected.  Pinch up 1 inch of skin and hold it.  Put the needle straight into the skin (90-degree angle). Put the needle in as far as it will go (to the hub). The needle may need to be injected at a 45-degree angle in small adults with little fat.  When the needle is in, you can let go of your skin.  Push the plunger down all the way to inject the insulin.  Pull the needle straight out of the skin.  Press the alcohol wipe over the spot where you gave your injection. Keep it there for a few seconds. Do not  rub the area.  Do not put the plastic cover back on the needle. Drawing up insulin: mixing 2 insulins  Wash your hands with soap and water.  Gently roll the vial of "cloudy" insulin between your hands or rotate the vial from top to bottom to mix.  Clean the top of both vials with an alcohol wipe. Be sure that the plastic pop-top lid has been removed on newer vials.  Pull air into the syringe to equal the dose of "cloudy" insulin.  Stick the needle into the "cloudy" insulin vial and inject the air. Be sure to keep the vial upright.  Remove the needle from the "cloudy" insulin vial.  Pull air into the syringe to equal the dose of "clear" insulin.  Stick the needle into the "clear" insulin vial and inject the air.  Leave the needle in the "clear" insulin vial and turn the vial upside down.  Pull down on the plunger and slowly draw into the syringe the number of units of "clear" insulin desired.  Look for air bubbles in the syringe. You may need to push the plunger up and down 2 to 3 times to slowly get rid of any air bubbles in the syringe.  Remove the needle from the "clear" insulin vial.  Stick the needle into the "cloudy" insulin vial. Do not inject any of the "clear" insulin into the "cloudy" vial.  Turn the "cloudy" vial upside down and pull the plunger down to the number of units that equals the total number of units of "clear" and "cloudy" insulins.  Remove the needle from the "cloudy" insulin vial.  Use an alcohol wipe to clean the area of the body to be injected.  Put the needle straight into the skin (90-degree angle). Put the needle in as far as it will go (to the hub). The needle may need to be injected at a 45-degree angle in small adults with little fat.  When the needle is in, you can let go of your skin.  Push the plunger down all the way to inject the insulin.  Pull the needle straight out of the skin.  Press the  alcohol wipe over the spot where you gave  your injection. Keep it there for a few seconds. Do not rub the area.  Do not put the plastic cover back on the needle. USING INSULIN PENS  Wash your hands with soap and water.  If you are using the "cloudy" insulin, roll the pen between your palms several times or rotate the pen top to bottom several times.  Remove the insulin pen cap.  Clean the rubber stopper of the cartridge with an alcohol wipe.  Remove the protective paper tab from the disposable needle.  Screw the needle onto the pen.  Remove the outer plastic needle cover.  Remove the inner plastic needle cover.  Prime the insulin pen by turning the button (dial) to 2 units. Hold the pen with the needle pointing up, and push the dial on the opposite end until a drop of insulin appears at the needle tip. If no insulin appears, repeat this step.  Dial the number of units of insulin you will inject.  Use an alcohol wipe to clean the area of the body to be injected.  Pinch up 1 inch of skin and hold it.  Put the needle straight into the skin (90-degree angle).  Push the dial down to push the insulin into the fat tissue.  Count to 10 slowly. Then, remove the needle from the fat tissue.  Carefully replace the larger outer plastic needle cover over the needle and unscrew the capped needle. THROWING AWAY SUPPLIES  Discard used needles in a puncture proof sharps disposal container. Follow disposal regulations for the area where you live.  Vials and empty disposable pens may be thrown away in the regular trash.   This information is not intended to replace advice given to you by your health care provider. Make sure you discuss any questions you have with your health care provider.   Document Released: 06/01/2003 Document Revised: 04/01/2014 Document Reviewed: 08/18/2012 Elsevier Interactive Patient Education 2016 [employer].   Diabetes Mellitus and Food It is important for you to manage your blood sugar (glucose)  level. Your blood glucose level can be greatly affected by what you eat. Eating healthier foods in the appropriate amounts throughout the day at about the same time each day will help you control your blood glucose level. It can also help slow or prevent worsening of your diabetes mellitus. Healthy eating may even help you improve the level of your blood pressure and reach or maintain a healthy weight.  General recommendations for healthful eating and cooking habits include:  Eating meals and snacks regularly. Avoid going long periods of time without eating to lose weight.  Eating a diet that consists mainly of plant-based foods, such as fruits, vegetables, nuts, legumes, and whole grains.  Using low-heat cooking methods, such as baking, instead of high-heat cooking methods, such as deep frying. Work with your dietitian to make sure you understand how to use the Nutrition Facts information on food labels. HOW CAN FOOD AFFECT ME? Carbohydrates Carbohydrates affect your blood glucose level more than any other type of food. Your dietitian will help you determine how many carbohydrates to eat at each meal and teach you how to count carbohydrates. Counting carbohydrates is important to keep your blood glucose at a healthy level, especially if you are using insulin or taking certain medicines for diabetes mellitus. Alcohol Alcohol can cause sudden decreases in blood glucose (hypoglycemia), especially if you use insulin or take certain medicines for diabetes mellitus. Hypoglycemia can  be a life-threatening condition. Symptoms of hypoglycemia (sleepiness, dizziness, and disorientation) are similar to symptoms of having too much alcohol.  If your health care provider has given you approval to drink alcohol, do so in moderation and use the following guidelines:  Women should not have more than one drink per day, and men should not have more than two drinks per day. One drink is equal to:  12 oz of beer.  5  oz of wine.  1 oz of hard liquor.  Do not drink on an empty stomach.  Keep yourself hydrated. Have water, diet soda, or unsweetened iced tea.  Regular soda, juice, and other mixers might contain a lot of carbohydrates and should be counted. WHAT FOODS ARE NOT RECOMMENDED? As you make food choices, it is important to remember that all foods are not the same. Some foods have fewer nutrients per serving than other foods, even though they might have the same number of calories or carbohydrates. It is difficult to get your body what it needs when you eat foods with fewer nutrients. Examples of foods that you should avoid that are high in calories and carbohydrates but low in nutrients include:  Trans fats (most processed foods list trans fats on the Nutrition Facts label).  Regular soda.  Juice.  Candy.  Sweets, such as cake, pie, doughnuts, and cookies.  Fried foods. WHAT FOODS CAN I EAT? Eat nutrient-rich foods, which will nourish your body and keep you healthy. The food you should eat also will depend on several factors, including:  The calories you need.  The medicines you take.  Your weight.  Your blood glucose level.  Your blood pressure level.  Your cholesterol level. You should eat a variety of foods, including:  Protein.  Lean cuts of meat.  Proteins low in saturated fats, such as fish, egg whites, and beans. Avoid processed meats.  Fruits and vegetables.  Fruits and vegetables that may help control blood glucose levels, such as apples, mangoes, and yams.  Dairy products.  Choose fat-free or low-fat dairy products, such as milk, yogurt, and cheese.  Grains, bread, pasta, and rice.  Choose whole grain products, such as multigrain bread, whole oats, and brown rice. These foods may help control blood pressure.  Fats.  Foods containing healthful fats, such as nuts, avocado, olive oil, canola oil, and fish. DOES EVERYONE WITH DIABETES MELLITUS HAVE THE SAME  MEAL PLAN? Because every person with diabetes mellitus is different, there is not one meal plan that works for everyone. It is very important that you meet with a dietitian who will help you create a meal plan that is just right for you.   This information is not intended to replace advice given to you by your health care provider. Make sure you discuss any questions you have with your health care provider.   Document Released: 12/06/2004 Document Revised: 04/01/2014 Document Reviewed: 02/05/2013 Elsevier Interactive Patient Education [employer]

## 2015-06-15 ENCOUNTER — Other Ambulatory Visit: Payer: Self-pay | Admitting: *Deleted

## 2015-06-15 ENCOUNTER — Telehealth: Payer: Self-pay | Admitting: Family Medicine

## 2015-06-15 LAB — VITAMIN D 25 HYDROXY (VIT D DEFICIENCY, FRACTURES): VIT D 25 HYDROXY: 10 ng/mL — AB (ref 30–100)

## 2015-06-15 MED ORDER — METFORMIN HCL ER 500 MG PO TB24: 500.0000 mg | ORAL_TABLET | Freq: Every day | ORAL | Status: DC

## 2015-06-15 MED ORDER — ERGOCALCIFEROL 1.25 MG (50000 UT) PO CAPS: 50000.0000 [IU] | ORAL_CAPSULE | ORAL | Status: DC

## 2015-06-15 NOTE — Telephone Encounter (Signed)
Already addressed by Suzette BattiestVeronica (see result notes)

## 2015-06-15 NOTE — Telephone Encounter (Signed)
Pt called answering service requesting a call back because he has questions about why everything tastes bad to him

## 2015-06-19 ENCOUNTER — Encounter: Payer: Self-pay | Admitting: Family Medicine

## 2015-06-19 ENCOUNTER — Ambulatory Visit (INDEPENDENT_AMBULATORY_CARE_PROVIDER_SITE_OTHER): Payer: Managed Care, Other (non HMO) | Admitting: Family Medicine

## 2015-06-19 VITALS — BP 126/84 | HR 100 | Ht 71.0 in | Wt 191.6 lb

## 2015-06-19 DIAGNOSIS — Z91199 Patient's noncompliance with other medical treatment and regimen due to unspecified reason: Secondary | ICD-10-CM

## 2015-06-19 DIAGNOSIS — Z9119 Patient's noncompliance with other medical treatment and regimen: Secondary | ICD-10-CM

## 2015-06-19 DIAGNOSIS — E119 Type 2 diabetes mellitus without complications: Secondary | ICD-10-CM

## 2015-06-19 DIAGNOSIS — E559 Vitamin D deficiency, unspecified: Secondary | ICD-10-CM | POA: Diagnosis not present

## 2015-06-19 DIAGNOSIS — E039 Hypothyroidism, unspecified: Secondary | ICD-10-CM | POA: Diagnosis not present

## 2015-06-19 LAB — POCT CBG (FASTING - GLUCOSE)-MANUAL ENTRY: GLUCOSE FASTING, POC: 493 mg/dL — AB (ref 70–99)

## 2015-06-19 NOTE — Patient Instructions (Signed)
   Dr. Altheimer's office will be calling you to schedule an appointment. In the meantime, please take the insulin as directed every day.  If you morning blood sugar is over 200, you can increase by 2 units every 3 days until you see Dr. Leslie DalesAltheimer. Please restart the Synthroid. Please fill the vitamin D precription. If you want to hold off on the metformin until you see Dr. Leslie DalesAltheimer, that is fine. Continue to avoid sugar in your diet (no regular sodas, sweets, lemonade, sweet tea, ice cream/sherbert).  Continue to avoid spicy/acidic foods that are irritating to your mouth/tongue. Nonfat yogurt would be a good choice for a snack. Continue with grilled chicken as a healthy protein source.

## 2015-06-19 NOTE — Progress Notes (Signed)
Chief Complaint  Patient presents with  . Diabetes    follow up. Did not do Basaglar injection Sat or Sun or today. Did not check his blood sugars either.   Patient presents to follow up on his new onset diabetes. Since his visit here last week, he only checked his sugar twice--445 on Friday, can't recall Thursday's, maybe 515. Only took the Basaglar injections on Thursday and Friday, none since then. Scheduled for diabetes education on 4/25. He states he wasn't offered sooner appointment.  Never picked up the metformin or Vitamin D prescriptions. Didn't restart the thyroid medication that he has at home.  The taste doesn't seem to be as bad, but still not eating right.  He was able to eat grilled chicken yesterday, but couldn't eat the chips/salsa from the [employer].  PMH, PSH, SH reviewed  Outpatient Encounter Prescriptions as of 06/19/2015  Medication Sig Note  . ALPRAZolam (XANAX) 0.5 MG tablet TAKE 1 TABLET 3 TIMES A DAY AS NEEDED FOR ANXIETY 06/19/2015: Has only taken one tablet (not once daily as previously written)  . glucose blood test strip Use as instructed   . Insulin Pen Needle 32G X 4 MM MISC 1 each by Does not apply route 2 (two) times daily.   . Lancets (ONETOUCH ULTRASOFT) lancets Use as instructed   . OLANZapine-FLUoxetine (SYMBYAX) 12-50 MG capsule Take 1 capsule by mouth daily. 06/14/2015: Received from: External Pharmacy  . ergocalciferol (VITAMIN D2) 50000 units capsule Take 1 capsule (50,000 Units total) by mouth once a week. (Patient not taking: Reported on 06/19/2015) 06/19/2015: Hasn't picked up the rx yet  . ibuprofen (ADVIL,MOTRIN) 200 MG tablet Take 800 mg by mouth every 6 (six) hours as needed. Reported on 06/19/2015 06/14/2015: Uses prn, infrequently  . Insulin Glargine (BASAGLAR KWIKPEN) 100 UNIT/ML SOPN Take 10 units SQ once daily, and increase by 3 units every 3-4 days as directed (Patient not taking: Reported on 06/19/2015) 06/19/2015: Took a shot on  Thursday and on Friday  . levothyroxine (SYNTHROID, LEVOTHROID) 125 MCG tablet TAKE 1 TABLET (125 MCG TOTAL) BY MOUTH DAILY BEFORE BREAKFAST. (Patient not taking: Reported on 06/14/2015) 06/19/2015: Has a bottle at home. Hasn't restarted it yet  . metFORMIN (GLUCOPHAGE-XR) 500 MG 24 hr tablet Take 1 tablet (500 mg total) by mouth daily with breakfast. (Patient not taking: Reported on 06/19/2015) 06/19/2015: Hasn't picked up prescription yet   No facility-administered encounter medications on file as of 06/19/2015.   No Known Allergies  ROS: no fever, chills, URI symptoms.  +change in taste, fatigue, polydipsia, polyuria. No bleeding, bruising, rash, chest pain, shortness of breath.   PHYSICAL EXAM: BP 126/84 mmHg  Pulse 100  Ht 5\' 11"  (1.803 m)  Wt 191 lb 9.6 oz (86.909 kg)  BMI 26.73 kg/m2 Well appearing male in no distress  Glucose today 493  Alert and oriented, normal mood, affect, mildly anxious/depressed.      Chemistry      Component Value Date/Time   NA 129* 06/14/2015 1222   K 4.7 06/14/2015 1222   CL 92* 06/14/2015 1222   CO2 27 06/14/2015 1222   BUN 17 06/14/2015 1222   CREATININE 1.19 06/14/2015 1222      Component Value Date/Time   CALCIUM 9.4 06/14/2015 1222   ALKPHOS 159* 06/14/2015 1222   AST 16 06/14/2015 1222   ALT 23 06/14/2015 1222   BILITOT 0.7 06/14/2015 1222     Glucose (nonfasting) was 536  Lab Results  Component Value Date  HGBA1C 14.0 06/14/2015   Lab Results  Component Value Date   TSH 67.10* 06/14/2015   Vitamin D-OH 10  Lab Results  Component Value Date   WBC 6.4 06/14/2015   HGB 14.9 06/14/2015   HCT 43.1 06/14/2015   MCV 89.8 06/14/2015   PLT 264 06/14/2015    ASSESSMENT/PLAN:  Diabetes mellitus, new onset (HCC) - noncompliant with insulin and checking sugars. Refer to Dr. Leslie Dales. Risks of noncompliance reviewed. - Plan: Glucose (CBG), Fasting  Vitamin D deficiency - encouraged him to fill rx and take as  directed  Hypothyroidism, unspecified hypothyroidism type - noncompliant. Encouraged to restart meds, be compliant with care  Noncompliance    Refer to Dr. Leslie Dales (his mother has seen him).  Spoke with Dr. Leslie Dales, who will have his office call and schedule   Dr. Altheimer's office will be calling you to schedule an appointment. In the meantime, please take the insulin as directed every day.  If you morning blood sugar is over 200, you can increase by 2 units every 3 days until you see Dr. Leslie Dales. Please restart the Synthroid. Please fill the vitamin D precription. If you want to hold off on the metformin until you see Dr. Leslie Dales, that is fine. Continue to avoid sugar in your diet (no regular sodas, sweets, lemonade, sweet tea, ice cream/sherbert).  Continue to avoid spicy/acidic foods that are irritating to your mouth/tongue. Nonfat yogurt would be a good choice for a snack. Continue with grilled chicken as a healthy protein source.

## 2015-06-20 ENCOUNTER — Telehealth: Payer: Self-pay | Admitting: Family Medicine

## 2015-06-20 NOTE — Telephone Encounter (Signed)
Thank you.  Part of his problem is that he isn't tasting food properly, with some tongue issue, and so I had told him to also avoid citrus, acidic foods, etc that might bother his tongue.  He could use as much help re: diabetes as possible. Thanks

## 2015-06-20 NOTE — Telephone Encounter (Signed)
Pt called and said he was starving to death.   He said Dr. Lynelle DoctorKnapp advised he could eat a peanut butter and jelly sandwich.  I explained if that is what she told you than go ahead.  I have printed him off Quick breakfast, lunch and dinner ideas from [employer] along with snack tips and mailed out to pt.

## 2015-06-22 ENCOUNTER — Encounter: Payer: Self-pay | Admitting: Family Medicine

## 2015-06-22 ENCOUNTER — Telehealth: Payer: Self-pay | Admitting: Family Medicine

## 2015-06-22 NOTE — Telephone Encounter (Signed)
This was sent to me.

## 2015-06-22 NOTE — Telephone Encounter (Signed)
Pt called and states he cannot go back to work until he has a note from you.  He works for [employer]  He works on [employer] for [employer].

## 2015-06-22 NOTE — Telephone Encounter (Signed)
He was complaining of difficulty seeing with his glasses on when he was here.  This is related to high sugars, and not sure if this would impact the quality of his work.  I need to know that his sugars are 300 or less before releasing him back to work.  One way that he can do this, is to take his medications as directed, or else sugars will remain high.  See if he got called by endocrinologist yet, and when appointment is scheduled. We had discussed increasing his insulin dose by 2-3 units if sugars still very high, be sure he isn't still taking just 10 (if/when actually even taking). He should also schedule f/u with Dr. Evelene CroonKaur for his depression.  We need him to be able to take care of himself properly.

## 2015-06-22 NOTE — Telephone Encounter (Signed)
Spoke with patient, states that he missed insulin last night, has not picked up pills yet and has not taken blood sugar today or yesterday. He feels really depressed about all of this. 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 He states that he feels that it is safe for him to return to work, he is not having any blurred vision or dizziness of any sort and then states "at least he doesn't think so."

## 2015-06-22 NOTE — Telephone Encounter (Signed)
Patient advised, his sugar was 269 so okay for note per Dr.Knapp.

## 2015-06-23 NOTE — Telephone Encounter (Signed)
Letter typed and put on door for pt to pick up

## 2015-07-01 ENCOUNTER — Other Ambulatory Visit: Payer: Self-pay | Admitting: Family Medicine

## 2015-07-18 ENCOUNTER — Ambulatory Visit: Payer: Managed Care, Other (non HMO)

## 2015-07-25 ENCOUNTER — Ambulatory Visit: Payer: Managed Care, Other (non HMO)

## 2015-08-01 ENCOUNTER — Ambulatory Visit: Payer: Managed Care, Other (non HMO)

## 2015-09-20 ENCOUNTER — Encounter: Payer: Self-pay | Admitting: Family Medicine

## 2015-09-20 ENCOUNTER — Ambulatory Visit (INDEPENDENT_AMBULATORY_CARE_PROVIDER_SITE_OTHER): Payer: Managed Care, Other (non HMO) | Admitting: Family Medicine

## 2015-09-20 VITALS — BP 122/76 | HR 68 | Wt 192.6 lb

## 2015-09-20 DIAGNOSIS — E559 Vitamin D deficiency, unspecified: Secondary | ICD-10-CM

## 2015-09-20 DIAGNOSIS — F329 Major depressive disorder, single episode, unspecified: Secondary | ICD-10-CM

## 2015-09-20 DIAGNOSIS — Z9114 Patient's other noncompliance with medication regimen: Secondary | ICD-10-CM

## 2015-09-20 DIAGNOSIS — E039 Hypothyroidism, unspecified: Secondary | ICD-10-CM

## 2015-09-20 DIAGNOSIS — R066 Hiccough: Secondary | ICD-10-CM

## 2015-09-20 DIAGNOSIS — F32A Depression, unspecified: Secondary | ICD-10-CM

## 2015-09-20 MED ORDER — CHLORPROMAZINE HCL 25 MG PO TABS: ORAL_TABLET | ORAL | Status: DC

## 2015-09-20 NOTE — Patient Instructions (Signed)
Chlorpromazine 25mg --take this every 6 to 8 hours until your hiccups are gone.  (do NOT restart the Symbyax generic until you are no longer taking this). If you are using this regularly for 2-3 days and you are still getting the hiccups, options are to get an injection of this (we don't carry this medication here.  You can check with Dr. Evelene CroonKaur vs going to the ER). The other option would be simply to resume your regular medications (including the psychiatric medication that has a similar antipsychotic medication within it--generic Symbyax) as well as your thyroid, Vitamin D. You can discuss the metformin with Dr. Leslie DalesAltheimer tomorrow.  That likely should be restarted.  You should stop drinking the pepsi's. If your sugars are getting too low (consistently <70), the insulin should be adjusted, not eating extra sugar to avoid low blood sugars.  It doesn't sound like your sugars have gotten low enough to warrant taking any extra sugar at all; I don't think your insulin is too much. You must stop the Pepsi drinking.  Hiccups A hiccup is the result of a sudden shortening of the muscle below your lungs (diaphragm). This movement of your diaphragm causes a sudden inhalation followed by the closing of your vocal cords, which causes the hiccup sound. Most people get the hiccups. Typically, hiccups last only a short amount of time.  There are three types of hiccups:   Benign. These hiccups last less than 48 hours.   Persistent. These hiccups last more than 48 hours, but less than 1 month.   Intractable. These hiccups last more than 1 month.  A hiccup is a reflex. You cannot control reflexes.  HOME CARE INSTRUCTIONS  Watch your hiccups for any changes. The following actions may help to lessen any discomfort that you are feeling:  Eat small meals.   Limit alcohol intake to no more than 1 drink per day for nonpregnant women and 2 drinks per day for men. One drink equals 12 oz of beer, 5 oz of wine, or 1 oz of  hard liquor.  Limit drinking carbonated or fizzy drinks, such as soda.  Eat and chew your food slowly.   Avoid eating or drinking hot or spicy foods and drinks.  Take medicines only as directed by your health care provider.  SEEK MEDICAL CARE IF:   Your hiccups last for more than 48 hours.   Your hiccups do not improve with treatment.  You cannot sleep or eat due to the hiccups.   You have unexpected weight loss due to the hiccups.   You have a fever.   You have trouble breathing or swallowing.   You develop severe pain in your abdomen.  You develop numbness, tingling, or weakness.   This information is not intended to replace advice given to you by your health care provider. Make sure you discuss any questions you have with your health care provider.   Document Released: 05/20/2001 Document Revised: 07/26/2014 Document Reviewed: 03/07/2014 Elsevier Interactive Patient Education [employer]2016 Elsevier Inc.

## 2015-09-20 NOTE — Progress Notes (Signed)
Chief Complaint  Patient presents with  . Hiccups    x 3 days   Patient presents with complaint of hiccups x 3 days.  He states they come and go--He had them 11am when he called for the appointment.  After drinking water from [employer] on his way here, they resolved.    He asks "Do you really think I will feel better if I take my medicine like I'm supposed to?" He admits noncompliance with his thyroid medication, Vitamin D rx, metformin, and symbyax (generic).  He is now under the care of Dr. Leslie DalesAltheimer for his diabetes (which was newly diagnosed in March).  Sugars are reported to be in the low 90's to low 100's.  He admits that he started drinking Pepsi's again when the sugars dropped down into the normal range.  He is compliant with taking the Victoza and Basaglar, but not the metformin (or any other oral medications.)  PMH, PSH, SH reviewed.  Outpatient Encounter Prescriptions as of 09/20/2015  Medication Sig Note  . glucose blood test strip Use as instructed   . Insulin Glargine (BASAGLAR KWIKPEN) 100 UNIT/ML SOPN Take 10 units SQ once daily, and increase by 3 units every 3-4 days as directed 09/20/2015: Takes 20 Units daily  . Insulin Pen Needle 32G X 4 MM MISC 1 each by Does not apply route 2 (two) times daily.   . Lancets (ONETOUCH ULTRASOFT) lancets Use as instructed   . Liraglutide (VICTOZA) 18 MG/3ML SOPN Inject 1.6 mg into the skin daily.   Marland Kitchen. ALPRAZolam (XANAX) 0.5 MG tablet Reported on 09/20/2015 06/19/2015: Has only taken one tablet (not once daily as previously written)  . chlorproMAZINE (THORAZINE) 25 MG tablet Take 1 tablet every 6 to 8 hours as needed until your hiccups are gone   . ergocalciferol (VITAMIN D2) 50000 units capsule Take 1 capsule (50,000 Units total) by mouth once a week. (Patient not taking: Reported on 06/19/2015) 06/19/2015: Hasn't picked up the rx yet  . ibuprofen (ADVIL,MOTRIN) 200 MG tablet Take 800 mg by mouth every 6 (six) hours as needed. Reported on  09/20/2015 06/14/2015: Uses prn, infrequently  . levothyroxine (SYNTHROID, LEVOTHROID) 125 MCG tablet TAKE 1 TAB BY MOUTH DAILY BEFORE BREAKFAST **PT NEEDS AN APPOINTMENT BEFORE ANY MORE REFILLS GIVEN (Patient not taking: Reported on 09/20/2015)   . metFORMIN (GLUCOPHAGE-XR) 500 MG 24 hr tablet Take 1 tablet (500 mg total) by mouth daily with breakfast. (Patient not taking: Reported on 06/19/2015) 06/19/2015: Hasn't picked up prescription yet  . OLANZapine-FLUoxetine (SYMBYAX) 12-50 MG capsule Take 1 capsule by mouth daily. Reported on 09/20/2015 06/14/2015: Received from: External Pharmacy   No facility-administered encounter medications on file as of 09/20/2015.   (thorazine was rx'd today, not prior to visit).  No Known Allergies  ROS:  +fatigue, depression, chronic.  He denies fever, chills, URI symptoms, chest pain, shortness of breath.  He does report some cough, which he relates to breathing in fumes/smoke at work.  No nausea, vomiting, rash or other complaints.  See HPI.  PHYSICAL EXAM: BP 122/76 mmHg  Pulse 68  Wt 192 lb 9.6 oz (87.363 kg)  Well developed, pleasant male in no distress. He does not currently have the hiccups HEENT: PERRL, EOMI, conjunctiva and sclera are clear. OP clear Neck: no lymphadenopathy or thyromegaly Heart: regular rate and rhythm Lungs: clear bilaterally Skin: normal turgor, color, no rash  ASSESSMENT/PLAN:  Intractable hiccups - Plan: chlorproMAZINE (THORAZINE) 25 MG tablet  Noncompliance with medications  Hypothyroidism, unspecified hypothyroidism  type - encouraged compliance with his medication  Vitamin D deficiency - encouraged compliance with his medication.  To answer his question--YES, he will feel better if he takes all of his medications as prescribed  Depression - encouraged him to restart symbyax after taking the thorazine to clear the hiccups (not to take both together). f/u with Dr. Evelene CroonKaur if depression not controlled    Chlorpromazine  25mg --take this every 6 to 8 hours until your hiccups are gone.  (do NOT restart the Symbyax generic until you are no longer taking this). If you are using this regularly for 2-3 days and you are still getting the hiccups, options are to get an injection of this (we don't carry this medication here.  You can check with Dr. Evelene CroonKaur vs going to the ER). The other option would be simply to resume your regular medications (including the psychiatric medication that has a similar antipsychotic medication within it--generic Symbyax) as well as your thyroid, Vitamin D. You can discuss the metformin with Dr. Leslie DalesAltheimer tomorrow.  That likely should be restarted.  You should stop drinking the pepsi's. If your sugars are getting too low (consistently <70), the insulin should be adjusted, not eating extra sugar to avoid low blood sugars.  It doesn't sound like your sugars have gotten low enough to warrant taking any extra sugar at all; I don't think your insulin is too much. You must stop the Pepsi drinking.

## 2015-09-21 ENCOUNTER — Other Ambulatory Visit: Payer: Self-pay | Admitting: *Deleted

## 2015-09-21 ENCOUNTER — Telehealth: Payer: Self-pay

## 2015-09-21 LAB — HEMOGLOBIN A1C: HEMOGLOBIN A1C: 5.6

## 2015-09-21 MED ORDER — SYNTHROID 125 MCG PO TABS: 125.0000 ug | ORAL_TABLET | Freq: Every day | ORAL | Status: DC

## 2015-09-21 NOTE — Telephone Encounter (Signed)
Patient called and stated he wants to get back on thyroid meds please call him back

## 2015-09-23 ENCOUNTER — Other Ambulatory Visit: Payer: Self-pay | Admitting: Family Medicine

## 2015-09-25 NOTE — Telephone Encounter (Signed)
Patient states he never took the first Vit he lost it so I refilled it

## 2015-09-28 ENCOUNTER — Encounter: Payer: Self-pay | Admitting: *Deleted

## 2015-10-25 ENCOUNTER — Ambulatory Visit (INDEPENDENT_AMBULATORY_CARE_PROVIDER_SITE_OTHER): Payer: Managed Care, Other (non HMO)

## 2015-10-25 ENCOUNTER — Ambulatory Visit (HOSPITAL_COMMUNITY)
Admission: EM | Admit: 2015-10-25 | Discharge: 2015-10-25 | Disposition: A | Discharge status: Home / Self Care | Payer: Managed Care, Other (non HMO) | Attending: Family Medicine | Admitting: Family Medicine

## 2015-10-25 ENCOUNTER — Encounter (HOSPITAL_COMMUNITY): Payer: Self-pay | Admitting: Emergency Medicine

## 2015-10-25 DIAGNOSIS — Z23 Encounter for immunization: Secondary | ICD-10-CM | POA: Diagnosis not present

## 2015-10-25 DIAGNOSIS — S90451A Superficial foreign body, right great toe, initial encounter: Secondary | ICD-10-CM | POA: Diagnosis not present

## 2015-10-25 MED ORDER — TETANUS-DIPHTH-ACELL PERTUSSIS 5-2.5-18.5 LF-MCG/0.5 IM SUSP
0.5000 mL | Freq: Once | INTRAMUSCULAR | Status: AC
  Administered 2015-10-25: 0.5 mL via INTRAMUSCULAR

## 2015-10-25 MED ORDER — TETANUS-DIPHTH-ACELL PERTUSSIS 5-2.5-18.5 LF-MCG/0.5 IM SUSP
INTRAMUSCULAR | Status: AC
  Filled 2015-10-25: qty 0.5

## 2015-10-25 NOTE — Discharge Instructions (Signed)
Soak your toe daily, see orthopedist if further problems.

## 2015-10-25 NOTE — ED Provider Notes (Signed)
[facility]    CSN: [id number] Arrival date & time: 10/25/15  1839  First Provider Contact:  None       History   Chief Complaint Chief Complaint  Patient presents with  . Foreign Body in Skin    HPI Johnathan Estrada is a 60 y.o. male.    Foreign Body  Location:  Skin (rt gt toe.) Suspected object:  Glass Pain quality:  Sharp Pain severity:  Mild Duration:  4 days Progression since onset: no imrovement with self probing. Chronicity:  New Worsened by:  Certain positions Ineffective treatments:  None tried   Past Medical History:  Diagnosis Date  . Allergic rhinitis, cause unspecified    s/p immunotherapy  . Depression    sees Dr. Evelene Croon  . Diabetes (HCC) 05/2015  . Diverticulosis 7/09  . Head injury 3/08   with facial fracture  . Internal hemorrhoid 7/09  . Male hypogonadism 2013   low testosterone  . Unspecified hypothyroidism     Patient Active Problem List   Diagnosis Date Noted  . Right inguinal hernia 01/13/2014  . Noncompliance with medication regimen 07/14/2012  . Hypogonadism male 06/07/2011  . Vitamin D deficiency 06/07/2011  . Decreased libido 06/06/2011  . Fatigue 09/19/2010  . Hypothyroidism 09/19/2010  . Depression 09/19/2010    Past Surgical History:  Procedure Laterality Date  . CARPAL TUNNEL RELEASE Right    right  . COLONOSCOPY  10/07/07   Dr. Bosie Clos; diverticulosis and small internal hemorrhoids; repeat 10 yrs  . INGUINAL HERNIA REPAIR  2001   Dr. Ezzard Standing  . KNEE SURGERY Right 8/07   right, arthroscopic; Dr. Thurston Hole  . ORIF FINGER FRACTURE Right    R 3rd and 4th fingers  . SHOULDER SURGERY     bilateral (10/05 L; 8/07 R)       Home Medications    Prior to Admission medications   Medication Sig Start Date End Date Taking? Authorizing Provider  glucose blood test strip Use as instructed 06/14/15  Yes Joselyn Arrow, MD  Insulin Glargine (BASAGLAR KWIKPEN) 100 UNIT/ML SOPN Take 10 units SQ once daily, and increase by 3  units every 3-4 days as directed 06/14/15  Yes Joselyn Arrow, MD  Insulin Pen Needle 32G X 4 MM MISC 1 each by Does not apply route 2 (two) times daily. 06/14/15  Yes Joselyn Arrow, MD  Lancets Letta Pate ULTRASOFT) lancets Use as instructed 06/14/15  Yes Joselyn Arrow, MD  Liraglutide (VICTOZA) 18 MG/3ML SOPN Inject 1.6 mg into the skin daily.   Yes Historical Provider, MD  ALPRAZolam Prudy Feeler) 0.5 MG tablet Reported on 09/20/2015 04/11/15   Historical Provider, MD  chlorproMAZINE (THORAZINE) 25 MG tablet Take 1 tablet every 6 to 8 hours as needed until your hiccups are gone 09/20/15   Joselyn Arrow, MD  ibuprofen (ADVIL,MOTRIN) 200 MG tablet Take 800 mg by mouth every 6 (six) hours as needed. Reported on 09/20/2015    Historical Provider, MD  metFORMIN (GLUCOPHAGE-XR) 500 MG 24 hr tablet Take 1 tablet (500 mg total) by mouth daily with breakfast. Patient not taking: Reported on 06/19/2015 06/15/15   Joselyn Arrow, MD  OLANZapine-FLUoxetine (SYMBYAX) 12-50 MG capsule Take 1 capsule by mouth daily. Reported on 09/20/2015 05/26/15   Historical Provider, MD  SYNTHROID 125 MCG tablet Take 1 tablet (125 mcg total) by mouth daily before breakfast. 09/21/15   Joselyn Arrow, MD  Vitamin D, Ergocalciferol, (DRISDOL) 50000 units CAPS capsule TAKE 1 CAPSULE (50,000 UNITS TOTAL) BY MOUTH  ONCE A WEEK. 09/25/15   Joselyn Arrow, MD    Family History Family History  Problem Relation Age of Onset  . Diabetes Mother   . Heart disease Mother     irregular heartbeat  . COPD Mother   . Diabetes Brother   . Cancer Maternal Uncle     colon cancer  . Stroke Maternal Grandmother   . Heart disease Maternal Aunt   . Heart disease Maternal Uncle     Social History Social History  Substance Use Topics  . Smoking status: Never Smoker  . Smokeless tobacco: Never Used  . Alcohol use Yes     Comment: 1-2 beers per year.     Allergies   Review of patient's allergies indicates no known allergies.   Review of Systems Review of Systems  Constitutional:  Negative.   Musculoskeletal: Positive for gait problem.  Skin: Positive for wound.  All other systems reviewed and are negative.    Physical Exam Triage Vital Signs ED Triage Vitals  Enc Vitals Group     BP 10/25/15 1940 110/80     Pulse Rate 10/25/15 1940 81     Resp 10/25/15 1940 18     Temp 10/25/15 1940 98.3 F (36.8 C)     Temp Source 10/25/15 1940 Oral     SpO2 10/25/15 1940 96 %     Weight 10/25/15 1940 195 lb (88.5 kg)     Height 10/25/15 1940  (1.778 m)     Head Circumference --      Peak Flow --      Pain Score 10/25/15 1948 4     Pain Loc --      Pain Edu? --      Excl. in GC? --    No data found.   Updated Vital Signs BP 110/80 (BP Location: Left Arm)   Pulse 81   Temp 98.3 F (36.8 C) (Oral)   Resp 18   Ht  (1.778 m)   Wt 195 lb (88.5 kg)   SpO2 96%   BMI 27.98 kg/m   Visual Acuity Right Eye Distance:   Left Eye Distance:   Bilateral Distance:    Right Eye Near:   Left Eye Near:    Bilateral Near:     Physical Exam  Constitutional: He appears well-developed and well-nourished.  Skin: Skin is warm and dry.  Min tender puncture site on plantar surface of rt gt toe, no sign of infection.  Nursing note and vitals reviewed.    UC Treatments / Results  Labs (all labs ordered are listed, but only abnormal results are displayed) Labs Reviewed - No data to display  EKG  EKG Interpretation None       Radiology No results found. X-rays reviewed and report per radiologist.  Procedures Procedures (including critical care time)  Medications Ordered in UC Medications  Tdap (BOOSTRIX) injection 0.5 mL (not administered)     Initial Impression / Assessment and Plan / UC Course  I have reviewed the triage vital signs and the nursing notes.  Pertinent labs & imaging results that were available during my care of the patient were reviewed by me and considered in my medical decision making (see chart for details).  Clinical  Course      Final Clinical Impressions(s) / UC Diagnoses   Final diagnoses:  None    New Prescriptions New Prescriptions   No medications on file     Linna Hoff, MD  10/25/15 2031  

## 2015-10-25 NOTE — ED Triage Notes (Signed)
The patient presented to the [facility] with a complaint of a possible piece of glass in his right great toe. The patient stated that he stepped on glass 4 days ago and has attempted to "dig" the glass out but is concerned about infection since he is a diabetic.

## 2016-01-26 LAB — HEMOGLOBIN A1C: Hemoglobin A1C: 5.6

## 2016-02-08 ENCOUNTER — Encounter: Payer: Self-pay | Admitting: *Deleted

## 2016-02-14 ENCOUNTER — Ambulatory Visit: Payer: Self-pay

## 2016-02-14 ENCOUNTER — Other Ambulatory Visit: Payer: Self-pay | Admitting: Occupational Medicine

## 2016-02-14 DIAGNOSIS — Z021 Encounter for pre-employment examination: Secondary | ICD-10-CM

## 2016-03-04 ENCOUNTER — Ambulatory Visit (INDEPENDENT_AMBULATORY_CARE_PROVIDER_SITE_OTHER): Payer: Managed Care, Other (non HMO) | Admitting: Family Medicine

## 2016-03-04 ENCOUNTER — Encounter: Payer: Self-pay | Admitting: Family Medicine

## 2016-03-04 VITALS — BP 114/78 | HR 84 | Ht 71.0 in | Wt 198.0 lb

## 2016-03-04 DIAGNOSIS — K4090 Unilateral inguinal hernia, without obstruction or gangrene, not specified as recurrent: Secondary | ICD-10-CM

## 2016-03-04 DIAGNOSIS — R5382 Chronic fatigue, unspecified: Secondary | ICD-10-CM | POA: Diagnosis not present

## 2016-03-04 DIAGNOSIS — Z23 Encounter for immunization: Secondary | ICD-10-CM | POA: Diagnosis not present

## 2016-03-04 DIAGNOSIS — I517 Cardiomegaly: Secondary | ICD-10-CM

## 2016-03-04 DIAGNOSIS — G4730 Sleep apnea, unspecified: Secondary | ICD-10-CM

## 2016-03-04 NOTE — Progress Notes (Signed)
Chief Complaint  Patient presents with  . Advice Only    had CXR and would like to go over results with you (in system). Also has a hernia and would like you to take a look at it. And is ready for referral for sleep study.   Johnathan Estrada. other    would like to know if you could recommend a doctor for his mother that has alzheimer's.     Had employment physical done last month.  He had chest x-ray (single view) and was told he might have an enlarged heart. He reports he was told that the EKG that was also done was normal. CXR result: FINDINGS: Mediastinum and hilar structures are normal. Lungs are clear. No pleural effusion or pneumothorax. Cardiomegaly. No pulmonary venous congestion. Degenerative changes thoracic spine. IMPRESSION: 1. Cardiomegaly.  No pulmonary venous congestion. 2. No acute pulmonary disease .   He has an inguinal hernia on the right.  It is getting larger, but isn't painful. It goes away immediately when he sits or lays down, recurs with standing. It has gotten large, and he is interested in potentially get this fixed, before it gets painful (like it did on the other side).  He is now interested in getting a sleep study (previously recommended). He has chronic fatigue.  Girlfriend states he frequently stops breathing at night for 13-20 seconds.  She has sleep apnea and states he has it.  He is still under the care of Dr. Evelene CroonKaur, saw her last on Saturday. He reports he no longer feels depressed, more "mad as hell". He has been off Symbyax for quite a while, and he had discussed with Dr. Evelene CroonKaur that he wasn't interested in restarting it (mainly related to fears of it contributing to his DM).  PMH, PSH, SH reviewed, FH reviewed.   Outpatient Encounter Prescriptions as of 03/04/2016  Medication Sig Note  . ALPRAZolam (XANAX) 0.5 MG tablet Reported on 09/20/2015 06/19/2015: Has only taken one tablet (not once daily as previously written)  . glucose blood test strip Use as instructed    . Insulin Glargine (BASAGLAR KWIKPEN) 100 UNIT/ML SOPN Take 10 units SQ once daily, and increase by 3 units every 3-4 days as directed 09/20/2015: Takes 20 Units daily  . Insulin Pen Needle 32G X 4 MM MISC 1 each by Does not apply route 2 (two) times daily.   . Liraglutide (VICTOZA) 18 MG/3ML SOPN Inject 1.6 mg into the skin daily.   Johnathan Estrada. SYNTHROID 125 MCG tablet Take 1 tablet (125 mcg total) by mouth daily before breakfast.   . diazepam (VALIUM) 10 MG tablet Take 10 mg by mouth every 6 (six) hours as needed.  03/04/2016: Received from: External Pharmacy  . ibuprofen (ADVIL,MOTRIN) 200 MG tablet Take 800 mg by mouth every 6 (six) hours as needed. Reported on 09/20/2015 06/14/2015: Uses prn, infrequently  . Lancets (ONETOUCH ULTRASOFT) lancets Use as instructed   . metFORMIN (GLUCOPHAGE-XR) 500 MG 24 hr tablet Take 1 tablet (500 mg total) by mouth daily with breakfast. (Patient not taking: Reported on 03/04/2016) 06/19/2015: Hasn't picked up prescription yet  . simvastatin (ZOCOR) 10 MG tablet Take 10 mg by mouth daily. 03/04/2016: Plans to start today  . VIAGRA 100 MG tablet  03/04/2016: Received from: External Pharmacy  . [DISCONTINUED] chlorproMAZINE (THORAZINE) 25 MG tablet Take 1 tablet every 6 to 8 hours as needed until your hiccups are gone (Patient not taking: Reported on 03/04/2016) 03/04/2016: Didn't need to take (was rx'd for hiccups)  . [  DISCONTINUED] OLANZapine-FLUoxetine (SYMBYAX) 12-50 MG capsule Take 1 capsule by mouth daily. Reported on 09/20/2015 03/04/2016: Hasn't been taking for a year. Saw Dr. Evelene CroonKaur recently, didn't recommend restarting (pt didn't want to)  . [DISCONTINUED] Vitamin D, Ergocalciferol, (DRISDOL) 50000 units CAPS capsule TAKE 1 CAPSULE (50,000 UNITS TOTAL) BY MOUTH ONCE A WEEK.    No facility-administered encounter medications on file as of 03/04/2016.    No Known Allergies  ROS:  No fever, chills, URI symptoms.  ED has improved. Denies headaches, dizziness, chest pain,  shortness of breath, edema, palpitations. +fatigue, apnea per HPI. He reports noncompliance with all oral meds--recently restarted thyroid meds, and plans to fill and restart metformin and simvastatin, as per Dr. Berline LopesAltheimer's recommendations.   PHYSICAL EXAM:  BP 120/88 (BP Location: Left Arm, Patient Position: Sitting, Cuff Size: Normal)   Pulse 84   Ht 5\' 11"  (1.803 m)   Wt 198 lb (89.8 kg)   BMI 27.62 kg/m   114/78 on repeat by MD  Well appearing male, in good spirits, in no distress HEENT: conjunctiva and sclera are clear Neck: no lymphadenopathy or mass Heart: regular rate and rhythm, no murmur Lungs: clear bilaterally Abdomen: soft, nontender.  +large bulge at right inguinal area.  Nontender, soft, and reducible.extremities: no edema Psych: normal mood, affect, hygiene and grooming Neuro: alert and oriented, cranial nerves intact, normal strength, gait.  ASSESSMENT/PLAN:  Chronic fatigue - multifactorial (poss OSA, noncompliance with hypothyroid treatment, depression); check sleep study; take meds as directed - Plan: Split night study  Need for prophylactic vaccination and inoculation against influenza - Plan: Flu Vaccine QUAD 36+ mos PF IM (Fluarix & Fluzone Quad PF)  Sleep apnea, unspecified type - apnea per witness. Risks of untreated OSA reviewed--schedule sleep study - Plan: Split night study  Cardiomegaly - noted on 1 view CXR; reportedly normal EKG (get result); Ddx reviewed. check sleep study, may need echo - Plan: Split night study  Right inguinal hernia - reducible.  Refer to surgeon for consult for elective repair - Plan: Ambulatory referral to General Surgery    We are referring you for a sleep study. Your insurance will dictate whether this is done at [facility] or at home. We will treat based on the results. We may need to consider getting an ultrasound of the heart to further evaluate any enlargement.  I'd like to see the sleep study results  and the EKG results. There was no evidence of congestive heart failure noted on the x-ray. This is one of the potential causes of enlarged heart.  Elevated blood pressure and other strain to the heart (including sleep apnea) can also contribute. It is reassuring that you have no shortness of breath or chest pain.   Please have them scan EKG into epic or fax us a copy so that I have access to the EKG.  We are referring you to the surgeons for a consult to discuss potential hernia repair.  This is not urgent. It IS urgent/emergent if the bulging becomes painful, and no longer easily resolves.

## 2016-03-04 NOTE — Patient Instructions (Addendum)
We are referring you for a sleep study. Your insurance will dictate whether this is done at [facility] or at home. We will treat based on the results. We may need to consider getting an ultrasound of the heart to further evaluate any enlargement.  I'd like to see the sleep study results and the EKG results. There was no evidence of congestive heart failure noted on the x-ray. This is one of the potential causes of enlarged heart.  Elevated blood pressure and other strain to the heart (including sleep apnea) can also contribute. It is reassuring that you have no shortness of breath or chest pain.   Please have them scan EKG into epic or fax us a copy so that I have access to the EKG.   Please restart taking the medications that you are supposed to be (that you said you planned to--metformin, simvastatin, etc).   We are referring you to the surgeons for a consult to discuss potential hernia repair.  This is not urgent. It IS urgent/emergent if the bulging becomes painful, and no longer easily resolves.     Inguinal Hernia, Adult Introduction An inguinal hernia is when fat or the intestines push through the area where the leg meets the lower abdomen (groin) and create a rounded lump (bulge). This condition develops over time. There are three types of inguinal hernias. These types include:  Hernias that can be pushed back into the belly (are reducible).  Hernias that are not reducible (are incarcerated).  Hernias that are not reducible and lose their blood supply (are strangulated). This type of hernia requires emergency surgery. What are the causes? This condition is caused by having a weak spot in the muscles or tissue. This weakness lets the hernia poke through. This condition can be triggered by:  Suddenly straining the muscles of the lower abdomen.  Lifting heavy objects.  Straining to have a bowel movement. Difficult bowel movements (constipation) can lead to  this.  Coughing. What increases the risk? This condition is more likely to develop in:  Men.  Pregnant women.  People who:  Are overweight.  Work in jobs that require long periods of standing or heavy lifting.  Have had an inguinal hernia before.  Smoke or have lung disease. These factors can lead to long-lasting (chronic) coughing. What are the signs or symptoms? Symptoms can depend on the size of the hernia. Often, a small inguinal hernia has no symptoms. Symptoms of a larger hernia include:  A lump in the groin. This is easier to see when the person is standing. It might not be visible when he or she is lying down.  Pain or burning in the groin. This occurs especially when lifting, straining, or coughing.  A dull ache or a feeling of pressure in the groin.  A lump in the scrotum in men. Symptoms of a strangulated inguinal hernia can include:  A bulge in the groin that is very painful and tender to the touch.  A bulge that turns red or purple.  Fever, nausea, and vomiting.  The inability to have a bowel movement or to pass gas. How is this diagnosed? This condition is diagnosed with a medical history and physical exam. Your health care provider may feel your groin area and ask you to cough. How is this treated? Treatment for this condition varies depending on the size of your hernia and whether you have symptoms. If you do not have symptoms, your health care provider may have  you watch your hernia carefully and come in for follow-up visits. If your hernia is larger or if you have symptoms, your treatment will include surgery. Follow these instructions at home: Lifestyle  Drink enough fluid to keep your urine clear or pale yellow.  Eat a diet that includes a lot of fiber. Eat plenty of fruits, vegetables, and whole grains. Talk with your health care provider if you have questions.  Avoid lifting heavy objects.  Avoid standing for long periods of time.  Do not use  tobacco products, including cigarettes, chewing tobacco, or e-cigarettes. If you need help quitting, ask your health care provider.  Maintain a healthy weight. General instructions  Do not try to force the hernia back in.  Watch your hernia for any changes in color or size. Let your health care provider know if any changes occur.  Take over-the-counter and prescription medicines only as told by your health care provider.  Keep all follow-up visits as told by your health care provider. This is important. Contact a health care provider if:  You have a fever.  You have new symptoms.  Your symptoms get worse. Get help right away if:  You have pain in the groin that suddenly gets worse.  A bulge in the groin gets bigger suddenly and does not go down.  You are a man and you have a sudden pain in the scrotum, or the size of your scrotum suddenly changes.  A bulge in the groin area becomes red or purple and is painful to the touch.  You have nausea or vomiting that does not go away.  You feel your heart beating a lot more quickly than normal.  You cannot have a bowel movement or pass gas. This information is not intended to replace advice given to you by your health care provider. Make sure you discuss any questions you have with your health care provider. Document Released: 07/28/2008 Document Revised: 08/17/2015 Document Reviewed: 01/19/2014  2017 Elsevier

## 2016-03-11 ENCOUNTER — Telehealth: Payer: Self-pay | Admitting: *Deleted

## 2016-03-11 NOTE — Telephone Encounter (Signed)
Left message for patient to let him know that he is scheduled with [facility] Dr Derrell LollingIngram 03/28/16 @ 10:30 for consultation for hernia repair. [address] tel [phone].

## 2016-03-27 ENCOUNTER — Other Ambulatory Visit: Payer: Self-pay | Admitting: *Deleted

## 2016-03-27 DIAGNOSIS — G4730 Sleep apnea, unspecified: Secondary | ICD-10-CM

## 2016-03-27 DIAGNOSIS — I517 Cardiomegaly: Secondary | ICD-10-CM

## 2016-03-27 DIAGNOSIS — R5382 Chronic fatigue, unspecified: Secondary | ICD-10-CM

## 2016-05-24 ENCOUNTER — Telehealth: Payer: Self-pay | Admitting: Family Medicine

## 2016-05-24 NOTE — Telephone Encounter (Signed)
He definitely should follow up with Dr. Evelene CroonKaur regarding his moods and anxiety. As far as asking about CPAP--I'm looking in the computer and see where we have ordered sleep studies (many times, last of which was for a home sleep study through [facility])--but I don't see results.  Has he done the study yet?? If he hasn't, then please have him call the sleep center and schedule the home study.  If he has sleep apnea, and it is treated, it should help with his energy, and also help reduce the risk for heart issues.  If he states that he has had the study already, I don't see any results scanned in the system

## 2016-05-24 NOTE — Telephone Encounter (Signed)
Pt wants to know if Dr Lynelle DoctorKnapp thinks the CPAP machine will help him much & make him feel better. If so he may be ready to start the process to get the machine.  Pt also wants Dr Lynelle DoctorKnapp to know that he is very stressed with taking care of his mom with alzheimers all by himself and regular day to day activities for himself including his own health. He said some days he wants "it all to be over and just die" but also made it clear that he by no means will commit suicide. He said Xanax and Valium is not helping him at all now. Should he go see Dr Evelene CroonKaur for this or come here? Per pt, Dr Althimer has told him that he is at a 75% chance of having a heart attack due to high stress

## 2016-05-27 NOTE — Telephone Encounter (Signed)
Pt called me back today. Gave him Dr Delford FieldKnapp's instructions. He never had sleep study so he will call to get that set up. Pt will also call Dr Evelene CroonKaur to set up an appoitment

## 2016-06-19 ENCOUNTER — Telehealth: Payer: Self-pay | Admitting: *Deleted

## 2016-06-19 NOTE — Telephone Encounter (Signed)
Advise pt that it is usually the insurance that dictates whether he can have the study done at the lab or at home.  I'm fine with him having a home study if that is what is covered. I believe that is why it was switched

## 2016-06-19 NOTE — Telephone Encounter (Signed)
Patient called and stated that [facility] set him up for a Home Sleep test and he feels that he should go to a sleep lab that he would get better results. Wants to know if you see any reason for him doing this at home as opposed to the lab. Doesn't want to do this at home and wants to know what he should do to have this changed.

## 2016-06-20 NOTE — Telephone Encounter (Signed)
Spoke with patient and he let me know that he now has new insurance anyway. I gave him the number to [facility] and recommended that he call there and let them know that he no longer has Monia Pouchetna and now has [employer] as they may need to get PA with new insurance before his test.
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
## 2016-07-29 ENCOUNTER — Ambulatory Visit (INDEPENDENT_AMBULATORY_CARE_PROVIDER_SITE_OTHER): Payer: BLUE CROSS/BLUE SHIELD | Admitting: Family Medicine

## 2016-07-29 ENCOUNTER — Telehealth: Payer: Self-pay | Admitting: Family Medicine

## 2016-07-29 ENCOUNTER — Telehealth: Payer: Self-pay

## 2016-07-29 ENCOUNTER — Encounter: Payer: Self-pay | Admitting: Family Medicine

## 2016-07-29 ENCOUNTER — Encounter (HOSPITAL_BASED_OUTPATIENT_CLINIC_OR_DEPARTMENT_OTHER): Payer: Self-pay

## 2016-07-29 VITALS — BP 136/84 | HR 80 | Ht 71.0 in | Wt 187.4 lb

## 2016-07-29 DIAGNOSIS — R5382 Chronic fatigue, unspecified: Secondary | ICD-10-CM

## 2016-07-29 DIAGNOSIS — G4730 Sleep apnea, unspecified: Secondary | ICD-10-CM

## 2016-07-29 DIAGNOSIS — K4090 Unilateral inguinal hernia, without obstruction or gangrene, not specified as recurrent: Secondary | ICD-10-CM

## 2016-07-29 DIAGNOSIS — F409 Phobic anxiety disorder, unspecified: Secondary | ICD-10-CM

## 2016-07-29 DIAGNOSIS — Z91199 Patient's noncompliance with other medical treatment and regimen due to unspecified reason: Secondary | ICD-10-CM

## 2016-07-29 DIAGNOSIS — Z9119 Patient's noncompliance with other medical treatment and regimen: Secondary | ICD-10-CM | POA: Diagnosis not present

## 2016-07-29 DIAGNOSIS — R454 Irritability and anger: Secondary | ICD-10-CM
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
## 2016-07-29 NOTE — Patient Instructions (Signed)
  I have no preference for which type of sleep study is done--whichever is covered by your health insurance. If they can do it at the sleep lab, I prefer them to try and do the split night study, if possible. If you are sleeping at the lab, be sure there is someone watching your mother.  We have previously referred you to the surgeon for the hernia repair--I'm having Suzette BattiestVeronica look into it and scheduling you an appointment.   Please continue to see Bonita QuinLinda regularly, as well as Dr. Evelene CroonKaur. I'm concerned that you need work on your anger management and relationship issues. You might need some changes made to your medication regimen (especially if you are staying in this relationship). Please consider contacting the police for a restraining/protective order--I would rather you get this, than do something that could land you in jail, and then leave your mother alone. Use the diazepam if needed (with caution, regarding sedation and working/driving).  It sounds like you need to use this since you have been so angry that you are destroying things.

## 2016-07-29 NOTE — Telephone Encounter (Signed)
Lurena JoinerRebecca asked him to sign a new HIPAA and patient was scared to stand in front of her as he may be seen.

## 2016-07-29 NOTE — Telephone Encounter (Signed)
Called pt to see if we should remove his mom from HIPAA.  He said he has terrible reception at his mom's house.  He will step out and call me back.

## 2016-08-15 ENCOUNTER — Telehealth: Payer: Self-pay | Admitting: *Deleted

## 2016-08-15 DIAGNOSIS — Z202 Contact with and (suspected) exposure to infections with a predominantly sexual mode of transmission: Secondary | ICD-10-CM

## 2016-08-15 NOTE — Telephone Encounter (Signed)
Patient wanted to know if you think he should come in for STD testing or is he just wasting his money? He is not having symptoms but wonders if he should just to be safe?

## 2016-08-15 NOTE — Telephone Encounter (Signed)
Entered orders for HIV, syphillis and GC/chlamydia tests (should be run from urine sample).  If there is any possibility that she has had unprotected intercourse with others, it is better to be safe than sorry. Orders entered for lab visit for urine and blood tests

## 2016-08-15 NOTE — Telephone Encounter (Signed)
Tried to call patient, mailbox is full(he told me it might be and that he would try back tomorrow). I will not be here but someone else can go over these with him

## 2016-09-03 ENCOUNTER — Other Ambulatory Visit: Payer: Self-pay | Admitting: General Surgery

## 2016-09-03 DIAGNOSIS — E119 Type 2 diabetes mellitus without complications: Secondary | ICD-10-CM | POA: Diagnosis not present

## 2016-09-03 DIAGNOSIS — K4090 Unilateral inguinal hernia, without obstruction or gangrene, not specified as recurrent: Secondary | ICD-10-CM | POA: Diagnosis not present

## 2016-09-03 DIAGNOSIS — E785 Hyperlipidemia, unspecified: Secondary | ICD-10-CM | POA: Diagnosis not present

## 2016-09-03 DIAGNOSIS — Z794 Long term (current) use of insulin: Secondary | ICD-10-CM | POA: Diagnosis not present

## 2016-09-05 DIAGNOSIS — F411 Generalized anxiety disorder: Secondary | ICD-10-CM | POA: Diagnosis not present

## 2016-09-05 DIAGNOSIS — F3342 Major depressive disorder, recurrent, in full remission: Secondary | ICD-10-CM | POA: Diagnosis not present

## 2016-10-23 DIAGNOSIS — H40033 Anatomical narrow angle, bilateral: Secondary | ICD-10-CM | POA: Diagnosis not present

## 2016-11-06 DIAGNOSIS — H40033 Anatomical narrow angle, bilateral: Secondary | ICD-10-CM | POA: Diagnosis not present

## 2017-01-10 DIAGNOSIS — K4090 Unilateral inguinal hernia, without obstruction or gangrene, not specified as recurrent: Secondary | ICD-10-CM | POA: Diagnosis not present

## 2017-01-10 DIAGNOSIS — E119 Type 2 diabetes mellitus without complications: Secondary | ICD-10-CM | POA: Diagnosis not present

## 2017-01-10 DIAGNOSIS — Z794 Long term (current) use of insulin: Secondary | ICD-10-CM | POA: Diagnosis not present

## 2017-01-10 DIAGNOSIS — E785 Hyperlipidemia, unspecified: Secondary | ICD-10-CM | POA: Diagnosis not present

## 2017-01-13 DIAGNOSIS — E1165 Type 2 diabetes mellitus with hyperglycemia: Secondary | ICD-10-CM | POA: Diagnosis not present

## 2017-01-13 DIAGNOSIS — E782 Mixed hyperlipidemia: Secondary | ICD-10-CM | POA: Insufficient documentation

## 2017-01-13 DIAGNOSIS — E039 Hypothyroidism, unspecified: Secondary | ICD-10-CM | POA: Diagnosis not present

## 2017-01-13 DIAGNOSIS — Z794 Long term (current) use of insulin: Secondary | ICD-10-CM | POA: Insufficient documentation

## 2017-01-13 DIAGNOSIS — E559 Vitamin D deficiency, unspecified: Secondary | ICD-10-CM | POA: Diagnosis not present

## 2017-01-15 LAB — HEMOGLOBIN A1C: Hemoglobin A1C: 5.2

## 2017-02-17 LAB — HEMOGLOBIN A1C: Hemoglobin A1C: 5.1

## 2017-02-20 DIAGNOSIS — E782 Mixed hyperlipidemia: Secondary | ICD-10-CM | POA: Diagnosis not present

## 2017-02-20 DIAGNOSIS — E1165 Type 2 diabetes mellitus with hyperglycemia: Secondary | ICD-10-CM | POA: Diagnosis not present

## 2017-02-20 DIAGNOSIS — Z23 Encounter for immunization: Secondary | ICD-10-CM | POA: Diagnosis not present

## 2017-02-20 DIAGNOSIS — E559 Vitamin D deficiency, unspecified: Secondary | ICD-10-CM | POA: Diagnosis not present

## 2017-02-20 DIAGNOSIS — E039 Hypothyroidism, unspecified: Secondary | ICD-10-CM | POA: Diagnosis not present

## 2017-02-27 ENCOUNTER — Encounter: Payer: Self-pay | Admitting: Family Medicine

## 2017-02-27 DIAGNOSIS — F331 Major depressive disorder, recurrent, moderate: Secondary | ICD-10-CM | POA: Diagnosis not present

## 2017-02-28 ENCOUNTER — Encounter: Payer: Self-pay | Admitting: Family Medicine

## 2017-02-28 ENCOUNTER — Other Ambulatory Visit: Payer: Self-pay | Admitting: General Surgery

## 2017-03-07 ENCOUNTER — Encounter (HOSPITAL_BASED_OUTPATIENT_CLINIC_OR_DEPARTMENT_OTHER): Payer: Self-pay | Admitting: *Deleted

## 2017-03-07 ENCOUNTER — Other Ambulatory Visit: Payer: Self-pay

## 2017-03-09 NOTE — H&P (Signed)
Johnathan Estrada [facility] Patient #: [id number] DOB: 06-10-1955 Single / Language: [place] / Race: White Male       History of Present Illness      The patient is a 61 year old male who presents with an inguinal hernia. This is a nice 61 year old gentleman referred by Dr. Joselyn ArrowEve Knapp for evaluation of a symptomatic right inguinal hernia. Dr. Leslie DalesAltheimer is his endocrinologist.     He has a history of a left inguinal hernia repair 16 or 18 years ago here in town. He's done well with that. He now has a 1-2 year history of recurrent progressive bulge in his right groin. This getting larger. It is now causing pain. It has been reducible. He wants something done about that.      He has insulin-dependent diabetes mellitus. Suspected sleep apnea but no sleep study or devices. Anxiety and anger problems. Hyperlipidemia.      Social history reveals that he has a girlfriend although she does not live with him every day. No children. Denies tobacco. Drinks denies alcohol. 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      At his request we will proceed with scheduling of open repair of right internal hernia with mesh. I discussed the indications, details, techniques, and numerous risk of the surgery with him. He is aware of the risk of bleeding, infection, nerve damage, chronic pain, recurrence, rare injury to the bladder or testicle with major reconstructive surgery. Somewhat increased risk of wound infection because of diabetes. He understands these issues well. All his questions were answered. He agrees with this plan.     He is going to check with Dr. Leslie DalesAltheimer regarding management of his insulin in the perioperative period We will [facility]   Past Surgical History  Open Inguinal Hernia Surgery  Left. Oral Surgery  Shoulder Surgery  Left.  Medication History  Victoza  (18MG /3ML Soln Pen-inj, Subcutaneous) Active. Synthroid (125MCG Tablet, Oral) Active. Sildenafil Citrate (100MG  Tablet, Oral) Active. Basaglar KwikPen (100UNIT/ML Soln Pen-inj, Subcutaneous) Active. OneTouch Verio (In Vitro) Active. Medications Reconciled  Other Problems Diabetes Mellitus  Inguinal Hernia  Thyroid Disease     Review of Systems  HEENT Present- Hearing Loss and Wears glasses/contact lenses. Not Present- Earache, Hoarseness, Nose Bleed, Oral Ulcers, Ringing in the Ears, Seasonal Allergies, Sinus Pain, Sore Throat, Visual Disturbances and Yellow Eyes. Gastrointestinal Present- Abdominal Pain. Not Present- Bloating, Bloody Stool, Change in Bowel Habits, Chronic diarrhea, Constipation, Difficulty Swallowing, Excessive gas, Gets full quickly at meals, Hemorrhoids, Indigestion, Nausea, Rectal Pain and Vomiting.  Vitals  Weight: 188.65 lb Height: 66in Body Surface Area: 1.95 m Body Mass Index: 30.45 kg/m  Temp.: 98.61F  Pulse: 76 (Regular)  BP: 130/80 (Sitting, Left Arm, Standard)       Physical Exam  General Mental Status-Alert. General Appearance-Consistent with stated age. Hydration-Well hydrated. Voice-Normal.  Head and Neck Head-normocephalic, atraumatic with no lesions or palpable masses. Trachea-midline. Thyroid Gland Characteristics - normal size and consistency.  Eye Eyeball - Bilateral-Extraocular movements intact. Sclera/Conjunctiva - Bilateral-No scleral icterus.  Chest and Lung Exam Chest and lung exam reveals -quiet, even and easy respiratory effort with no use of accessory muscles and on auscultation, normal breath sounds, no adventitious sounds and normal vocal resonance. Inspection Chest Wall - Normal. Back - normal.  Cardiovascular Cardiovascular examination reveals -normal heart sounds, regular rate and rhythm with no murmurs and normal pedal  pulses bilaterally.  Abdomen Inspection Inspection  of the abdomen reveals - No Hernias. Skin - Scar - no surgical scars. Palpation/Percussion Palpation and Percussion of the abdomen reveal - Soft, Non Tender, No Rebound tenderness, No Rigidity (guarding) and No hepatosplenomegaly. Auscultation Auscultation of the abdomen reveals - Bowel sounds normal.  Male Genitourinary Note: Right inguinal hernia the size of a lemon. Easily reducible. Left groin scar well-healed. No recurrence. Scrotum small. Both testicles present. No scrotal mass. Umbilicus normal.   Neurologic Neurologic evaluation reveals -alert and oriented x 3 with no impairment of recent or remote memory. Mental Status-Normal.  Musculoskeletal Normal Exam - Left-Upper Extremity Strength Normal and Lower Extremity Strength Normal. Normal Exam - Right-Upper Extremity Strength Normal and Lower Extremity Strength Normal.  Lymphatic Head & Neck  General Head & Neck Lymphatics: Bilateral - Description - Normal. Axillary  General Axillary Region: Bilateral - Description - Normal. Tenderness - Non Tender. Femoral & Inguinal  Generalized Femoral & Inguinal Lymphatics: Bilateral - Description - Normal. Tenderness - Non Tender.    Assessment & Plan  RIGHT INGUINAL HERNIA (K40.90) .  You have an obvious right inguinal hernia Fortunately, it is reducible and there is no immediate danger We will get bigger and become more painful, however There is no evidence of recurrent hernia on the left  He will be scheduled for open repair of right inguinal hernia with mesh We have discussed the indications, techniques, and risks of this surgery in detail  you will be able to go home the same day  I request that you check with Dr. Leslie DalesAltheimer regarding how to adjust your insulin the day and evening before surgery  TYPE 2 DIABETES MELLITUS WITH INSULIN THERAPY (E11.9) HYPERLIPIDEMIA, ACQUIRED (E78.5) HISTORY OF LEFT INGUINAL HERNIA REPAIR (Z98.890)    Angelia MouldHaywood M. Derrell LollingIngram,  M.D., [facility] General and Minimally invasive Surgery Breast and Colorectal Surgery Office:   [phone] Pager:   [phone]

## 2017-03-10 ENCOUNTER — Encounter (HOSPITAL_BASED_OUTPATIENT_CLINIC_OR_DEPARTMENT_OTHER)
Admission: RE | Admit: 2017-03-10 | Discharge: 2017-03-10 | Disposition: A | Discharge status: Home / Self Care | Payer: BLUE CROSS/BLUE SHIELD | Source: Ambulatory Visit | Attending: General Surgery | Admitting: General Surgery

## 2017-03-10 DIAGNOSIS — E785 Hyperlipidemia, unspecified: Secondary | ICD-10-CM | POA: Diagnosis not present

## 2017-03-10 DIAGNOSIS — F419 Anxiety disorder, unspecified: Secondary | ICD-10-CM | POA: Diagnosis not present

## 2017-03-10 DIAGNOSIS — E119 Type 2 diabetes mellitus without complications: Secondary | ICD-10-CM | POA: Diagnosis not present

## 2017-03-10 DIAGNOSIS — K4090 Unilateral inguinal hernia, without obstruction or gangrene, not specified as recurrent: Secondary | ICD-10-CM | POA: Diagnosis not present

## 2017-03-10 DIAGNOSIS — Z794 Long term (current) use of insulin: Secondary | ICD-10-CM | POA: Diagnosis not present

## 2017-03-10 DIAGNOSIS — F329 Major depressive disorder, single episode, unspecified: Secondary | ICD-10-CM | POA: Diagnosis not present

## 2017-03-10 DIAGNOSIS — Z79899 Other long term (current) drug therapy: Secondary | ICD-10-CM | POA: Diagnosis not present

## 2017-03-10 LAB — COMPREHENSIVE METABOLIC PANEL
ALK PHOS: 54 U/L (ref 38–126)
ALT: 14 U/L — AB (ref 17–63)
AST: 21 U/L (ref 15–41)
Albumin: 4.2 g/dL (ref 3.5–5.0)
Anion gap: 7 (ref 5–15)
BILIRUBIN TOTAL: 1.1 mg/dL (ref 0.3–1.2)
BUN: 15 mg/dL (ref 6–20)
CALCIUM: 9.5 mg/dL (ref 8.9–10.3)
CHLORIDE: 105 mmol/L (ref 101–111)
CO2: 27 mmol/L (ref 22–32)
CREATININE: 0.82 mg/dL (ref 0.61–1.24)
GFR calc Af Amer: >60 mL/min (ref >60)
Glucose, Bld: 103 mg/dL — ABNORMAL HIGH (ref 65–99)
Potassium: 4.3 mmol/L (ref 3.5–5.1)
Sodium: 139 mmol/L (ref 135–145)
Total Protein: 7.1 g/dL (ref 6.5–8.1)

## 2017-03-10 LAB — CBC WITH DIFFERENTIAL/PLATELET
BASOS ABS: 0 10*3/uL (ref 0.0–0.1)
Basophils Relative: 0 %
Eosinophils Absolute: 0.1 10*3/uL (ref 0.0–0.7)
Eosinophils Relative: 1 %
HEMATOCRIT: 43.2 % (ref 39.0–52.0)
HEMOGLOBIN: 15.2 g/dL (ref 13.0–17.0)
LYMPHS ABS: 0.9 10*3/uL (ref 0.7–4.0)
LYMPHS PCT: 16 %
MCH: 30.5 pg (ref 26.0–34.0)
MCHC: 35.2 g/dL (ref 30.0–36.0)
MCV: 86.6 fL (ref 78.0–100.0)
Monocytes Absolute: 0.6 10*3/uL (ref 0.1–1.0)
Monocytes Relative: 11 %
NEUTROS ABS: 4.2 10*3/uL (ref 1.7–7.7)
NEUTROS PCT: 72 %
PLATELETS: 231 10*3/uL (ref 150–400)
RBC: 4.99 MIL/uL (ref 4.22–5.81)
RDW: 12.8 % (ref 11.5–15.5)
WBC: 5.8 10*3/uL (ref 4.0–10.5)

## 2017-03-11 ENCOUNTER — Encounter (HOSPITAL_BASED_OUTPATIENT_CLINIC_OR_DEPARTMENT_OTHER)
Admission: RE | Disposition: A | Discharge status: Home / Self Care | Payer: Self-pay | Source: Ambulatory Visit | Attending: General Surgery

## 2017-03-11 ENCOUNTER — Ambulatory Visit (HOSPITAL_BASED_OUTPATIENT_CLINIC_OR_DEPARTMENT_OTHER)
Admission: RE | Admit: 2017-03-11 | Discharge: 2017-03-11 | Disposition: A | Discharge status: Home / Self Care | Payer: BLUE CROSS/BLUE SHIELD | Source: Ambulatory Visit | Attending: General Surgery | Admitting: General Surgery

## 2017-03-11 ENCOUNTER — Ambulatory Visit (HOSPITAL_BASED_OUTPATIENT_CLINIC_OR_DEPARTMENT_OTHER): Payer: BLUE CROSS/BLUE SHIELD | Admitting: Anesthesiology

## 2017-03-11 ENCOUNTER — Encounter (HOSPITAL_BASED_OUTPATIENT_CLINIC_OR_DEPARTMENT_OTHER): Payer: Self-pay | Admitting: Anesthesiology

## 2017-03-11 ENCOUNTER — Other Ambulatory Visit: Payer: Self-pay

## 2017-03-11 DIAGNOSIS — Z79899 Other long term (current) drug therapy: Secondary | ICD-10-CM | POA: Insufficient documentation

## 2017-03-11 DIAGNOSIS — E785 Hyperlipidemia, unspecified: Secondary | ICD-10-CM | POA: Insufficient documentation

## 2017-03-11 DIAGNOSIS — Z794 Long term (current) use of insulin: Secondary | ICD-10-CM | POA: Insufficient documentation

## 2017-03-11 DIAGNOSIS — K4090 Unilateral inguinal hernia, without obstruction or gangrene, not specified as recurrent: Secondary | ICD-10-CM | POA: Diagnosis not present

## 2017-03-11 DIAGNOSIS — F419 Anxiety disorder, unspecified: Secondary | ICD-10-CM | POA: Insufficient documentation

## 2017-03-11 DIAGNOSIS — F329 Major depressive disorder, single episode, unspecified: Secondary | ICD-10-CM | POA: Insufficient documentation

## 2017-03-11 DIAGNOSIS — E119 Type 2 diabetes mellitus without complications: Secondary | ICD-10-CM | POA: Insufficient documentation

## 2017-03-11 HISTORY — PX: INSERTION OF MESH: SHX5868

## 2017-03-11 HISTORY — PX: INGUINAL HERNIA REPAIR: SHX194

## 2017-03-11 LAB — GLUCOSE, CAPILLARY
GLUCOSE-CAPILLARY: 80 mg/dL (ref 65–99)
GLUCOSE-CAPILLARY: 96 mg/dL (ref 65–99)

## 2017-03-11 SURGERY — REPAIR, HERNIA, INGUINAL, ADULT
Anesthesia: General | Site: Groin | Laterality: Right

## 2017-03-11 SURGERY — REPAIR, HERNIA, INGUINAL, ADULT
Anesthesia: General | Laterality: Right

## 2017-03-11 MED ORDER — HYDROCODONE-ACETAMINOPHEN 5-325 MG PO TABS: 1.0000 | ORAL_TABLET | Freq: Four times a day (QID) | ORAL | 0 refills | Status: DC | PRN

## 2017-03-11 MED ORDER — SUCCINYLCHOLINE CHLORIDE 200 MG/10ML IV SOSY
PREFILLED_SYRINGE | INTRAVENOUS | Status: AC
  Filled 2017-03-11: qty 10

## 2017-03-11 MED ORDER — CHLORHEXIDINE GLUCONATE CLOTH 2 % EX PADS: 6.0000 | MEDICATED_PAD | Freq: Once | CUTANEOUS | Status: DC

## 2017-03-11 MED ORDER — FENTANYL CITRATE (PF) 100 MCG/2ML IJ SOLN
INTRAMUSCULAR | Status: AC
  Filled 2017-03-11: qty 2

## 2017-03-11 MED ORDER — LIDOCAINE 2% (20 MG/ML) 5 ML SYRINGE
INTRAMUSCULAR | Status: AC
  Filled 2017-03-11: qty 5

## 2017-03-11 MED ORDER — SUCCINYLCHOLINE CHLORIDE 20 MG/ML IJ SOLN
INTRAMUSCULAR | Status: DC | PRN
  Administered 2017-03-11: 100 mg via INTRAVENOUS

## 2017-03-11 MED ORDER — MIDAZOLAM HCL 2 MG/2ML IJ SOLN
1.0000 mg | INTRAMUSCULAR | Status: DC | PRN
  Administered 2017-03-11: 2 mg via INTRAVENOUS

## 2017-03-11 MED ORDER — PROPOFOL 10 MG/ML IV BOLUS
INTRAVENOUS | Status: DC | PRN
  Administered 2017-03-11: 150 mg via INTRAVENOUS

## 2017-03-11 MED ORDER — MIDAZOLAM HCL 2 MG/2ML IJ SOLN
INTRAMUSCULAR | Status: AC
  Filled 2017-03-11: qty 2

## 2017-03-11 MED ORDER — ONDANSETRON HCL 4 MG/2ML IJ SOLN
INTRAMUSCULAR | Status: DC | PRN
  Administered 2017-03-11: 4 mg via INTRAVENOUS

## 2017-03-11 MED ORDER — SUGAMMADEX SODIUM 200 MG/2ML IV SOLN
INTRAVENOUS | Status: DC | PRN
  Administered 2017-03-11: 200 mg via INTRAVENOUS

## 2017-03-11 MED ORDER — SUGAMMADEX SODIUM 200 MG/2ML IV SOLN
INTRAVENOUS | Status: AC
  Filled 2017-03-11: qty 2

## 2017-03-11 MED ORDER — EPHEDRINE 5 MG/ML INJ
INTRAVENOUS | Status: AC
  Filled 2017-03-11: qty 10

## 2017-03-11 MED ORDER — ROCURONIUM BROMIDE 100 MG/10ML IV SOLN
INTRAVENOUS | Status: DC | PRN
  Administered 2017-03-11: 40 mg via INTRAVENOUS

## 2017-03-11 MED ORDER — PHENYLEPHRINE 40 MCG/ML (10ML) SYRINGE FOR IV PUSH (FOR BLOOD PRESSURE SUPPORT)
PREFILLED_SYRINGE | INTRAVENOUS | Status: AC
  Filled 2017-03-11: qty 10

## 2017-03-11 MED ORDER — LACTATED RINGERS IV SOLN
INTRAVENOUS | Status: DC
  Administered 2017-03-11 (×3): via INTRAVENOUS

## 2017-03-11 MED ORDER — DEXAMETHASONE SODIUM PHOSPHATE 4 MG/ML IJ SOLN
INTRAMUSCULAR | Status: DC | PRN
  Administered 2017-03-11: 4 mg via INTRAVENOUS

## 2017-03-11 MED ORDER — ACETAMINOPHEN 500 MG PO TABS
1000.0000 mg | ORAL_TABLET | ORAL | Status: AC
  Administered 2017-03-11: 1000 mg via ORAL

## 2017-03-11 MED ORDER — MIDAZOLAM HCL 5 MG/5ML IJ SOLN
INTRAMUSCULAR | Status: DC | PRN
  Administered 2017-03-11: 1 mg via INTRAVENOUS

## 2017-03-11 MED ORDER — CEFAZOLIN SODIUM-DEXTROSE 2-4 GM/100ML-% IV SOLN
2.0000 g | INTRAVENOUS | Status: AC
  Administered 2017-03-11: 2 g via INTRAVENOUS

## 2017-03-11 MED ORDER — LIDOCAINE HCL (CARDIAC) 20 MG/ML IV SOLN
INTRAVENOUS | Status: DC | PRN
  Administered 2017-03-11: 30 mg via INTRAVENOUS

## 2017-03-11 MED ORDER — OXYCODONE HCL 5 MG PO TABS: 5.0000 mg | ORAL_TABLET | Freq: Once | ORAL | Status: DC | PRN

## 2017-03-11 MED ORDER — ACETAMINOPHEN 500 MG PO TABS
ORAL_TABLET | ORAL | Status: AC
  Filled 2017-03-11: qty 2

## 2017-03-11 MED ORDER — CEFAZOLIN SODIUM-DEXTROSE 2-4 GM/100ML-% IV SOLN
INTRAVENOUS | Status: AC
  Filled 2017-03-11: qty 100

## 2017-03-11 MED ORDER — SODIUM CHLORIDE 0.9 % IJ SOLN
INTRAMUSCULAR | Status: AC
Start: 2017-03-11 — End: ?
  Filled 2017-03-11: qty 10

## 2017-03-11 MED ORDER — CELECOXIB 200 MG PO CAPS
ORAL_CAPSULE | ORAL | Status: AC
Start: 2017-03-11 — End: ?
  Filled 2017-03-11: qty 1

## 2017-03-11 MED ORDER — GABAPENTIN 300 MG PO CAPS
ORAL_CAPSULE | ORAL | Status: AC
  Filled 2017-03-11: qty 1

## 2017-03-11 MED ORDER — EPHEDRINE SULFATE 50 MG/ML IJ SOLN
INTRAMUSCULAR | Status: DC | PRN
  Administered 2017-03-11: 10 mg via INTRAVENOUS

## 2017-03-11 MED ORDER — CELECOXIB 200 MG PO CAPS
200.0000 mg | ORAL_CAPSULE | ORAL | Status: AC
  Administered 2017-03-11: 200 mg via ORAL

## 2017-03-11 MED ORDER — LACTATED RINGERS IV SOLN: INTRAVENOUS | Status: DC

## 2017-03-11 MED ORDER — DEXAMETHASONE SODIUM PHOSPHATE 10 MG/ML IJ SOLN
INTRAMUSCULAR | Status: AC
  Filled 2017-03-11: qty 1

## 2017-03-11 MED ORDER — HYDROMORPHONE HCL 1 MG/ML IJ SOLN: 0.2500 mg | INTRAMUSCULAR | Status: DC | PRN

## 2017-03-11 MED ORDER — FENTANYL CITRATE (PF) 100 MCG/2ML IJ SOLN
INTRAMUSCULAR | Status: DC | PRN
  Administered 2017-03-11: 50 ug via INTRAVENOUS

## 2017-03-11 MED ORDER — MEPERIDINE HCL 25 MG/ML IJ SOLN: 6.2500 mg | INTRAMUSCULAR | Status: DC | PRN

## 2017-03-11 MED ORDER — SCOPOLAMINE 1 MG/3DAYS TD PT72
1.0000 | MEDICATED_PATCH | Freq: Once | TRANSDERMAL | Status: DC | PRN
Start: 2017-03-11 — End: 2017-03-11

## 2017-03-11 MED ORDER — GABAPENTIN 300 MG PO CAPS
300.0000 mg | ORAL_CAPSULE | ORAL | Status: AC
  Administered 2017-03-11: 300 mg via ORAL

## 2017-03-11 MED ORDER — BUPIVACAINE LIPOSOME 1.3 % IJ SUSP
INTRAMUSCULAR | Status: DC | PRN
  Administered 2017-03-11: 15 mL

## 2017-03-11 MED ORDER — ONDANSETRON HCL 4 MG/2ML IJ SOLN
INTRAMUSCULAR | Status: AC
  Filled 2017-03-11: qty 2

## 2017-03-11 MED ORDER — FENTANYL CITRATE (PF) 100 MCG/2ML IJ SOLN
50.0000 ug | INTRAMUSCULAR | Status: DC | PRN
  Administered 2017-03-11 (×2): 50 ug via INTRAVENOUS

## 2017-03-11 MED ORDER — OXYCODONE HCL 5 MG/5ML PO SOLN: 5.0000 mg | Freq: Once | ORAL | Status: DC | PRN

## 2017-03-11 MED ORDER — BUPIVACAINE-EPINEPHRINE (PF) 0.5% -1:200000 IJ SOLN
INTRAMUSCULAR | Status: DC | PRN
  Administered 2017-03-11: 30 mL via PERINEURAL

## 2017-03-11 MED ORDER — ROCURONIUM BROMIDE 10 MG/ML (PF) SYRINGE
PREFILLED_SYRINGE | INTRAVENOUS | Status: AC
Start: 2017-03-11 — End: ?
  Filled 2017-03-11: qty 5

## 2017-03-11 MED ORDER — SODIUM CHLORIDE 0.9 % IJ SOLN
INTRAMUSCULAR | Status: DC | PRN
Start: 2017-03-11 — End: 2017-03-11
  Administered 2017-03-11: 15 mL

## 2017-03-11 MED ORDER — PROMETHAZINE HCL 25 MG/ML IJ SOLN: 6.2500 mg | INTRAMUSCULAR | Status: DC | PRN

## 2017-03-11 SURGICAL SUPPLY — 60 items
ADH SKN CLS APL DERMABOND .7 (GAUZE/BANDAGES/DRESSINGS)
APL SKNCLS STERI-STRIP NONHPOA (GAUZE/BANDAGES/DRESSINGS)
BENZOIN TINCTURE PRP APPL 2/3 (GAUZE/BANDAGES/DRESSINGS) IMPLANT
BLADE CLIPPER SURG (BLADE) IMPLANT
BLADE HEX COATED 2.75 (ELECTRODE) ×2 IMPLANT
BLADE SURG 10 STRL SS (BLADE) ×2 IMPLANT
CANISTER SUCT 1200ML W/VALVE (MISCELLANEOUS) ×2 IMPLANT
CHLORAPREP W/TINT 26ML (MISCELLANEOUS) ×2 IMPLANT
COVER BACK TABLE 60X90IN (DRAPES) ×4 IMPLANT
COVER MAYO STAND STRL (DRAPES) ×2 IMPLANT
DECANTER SPIKE VIAL GLASS SM (MISCELLANEOUS) IMPLANT
DERMABOND ADVANCED (GAUZE/BANDAGES/DRESSINGS)
DERMABOND ADVANCED .7 DNX12 (GAUZE/BANDAGES/DRESSINGS) IMPLANT
DRAIN PENROSE 1/2X12 LTX STRL (WOUND CARE) ×2 IMPLANT
DRAPE LAPAROTOMY 100X72 PEDS (DRAPES) ×2 IMPLANT
DRAPE LAPAROTOMY TRNSV 102X78 (DRAPE) IMPLANT
DRAPE UTILITY XL STRL (DRAPES) ×2 IMPLANT
ELECT REM PT RETURN 9FT ADLT (ELECTROSURGICAL) ×2
ELECTRODE REM PT RTRN 9FT ADLT (ELECTROSURGICAL) ×1 IMPLANT
GAUZE SPONGE 4X4 12PLY STRL LF (GAUZE/BANDAGES/DRESSINGS) IMPLANT
GLOVE BIO SURGEON STRL SZ7 (GLOVE) ×1 IMPLANT
GLOVE EUDERMIC 7 POWDERFREE (GLOVE) ×2 IMPLANT
GOWN STRL REUS W/ TWL LRG LVL3 (GOWN DISPOSABLE) ×1 IMPLANT
GOWN STRL REUS W/ TWL XL LVL3 (GOWN DISPOSABLE) ×1 IMPLANT
GOWN STRL REUS W/TWL LRG LVL3 (GOWN DISPOSABLE) ×2
GOWN STRL REUS W/TWL XL LVL3 (GOWN DISPOSABLE) ×2
MESH ULTRAPRO 3X6 7.6X15CM (Mesh General) ×1 IMPLANT
NDL HYPO 25X1 1.5 SAFETY (NEEDLE) ×1 IMPLANT
NEEDLE HYPO 22GX1.5 SAFETY (NEEDLE) ×2 IMPLANT
NEEDLE HYPO 25X1 1.5 SAFETY (NEEDLE) ×2 IMPLANT
NS IRRIG 1000ML POUR BTL (IV SOLUTION) ×2 IMPLANT
PACK BASIN DAY SURGERY FS (CUSTOM PROCEDURE TRAY) ×2 IMPLANT
PENCIL BUTTON HOLSTER BLD 10FT (ELECTRODE) ×2 IMPLANT
SLEEVE SCD COMPRESS KNEE MED (MISCELLANEOUS) IMPLANT
SPONGE LAP 4X18 X RAY DECT (DISPOSABLE) IMPLANT
STAPLER VISISTAT 35W (STAPLE) IMPLANT
STRIP CLOSURE SKIN 1/2X4 (GAUZE/BANDAGES/DRESSINGS) IMPLANT
SUT MNCRL AB 4-0 PS2 18 (SUTURE) ×2 IMPLANT
SUT PROLENE 1 CT (SUTURE) IMPLANT
SUT PROLENE 2 0 CT2 30 (SUTURE) ×4 IMPLANT
SUT SILK 2 0 SH (SUTURE) IMPLANT
SUT SILK 2 0 TIES 17X18 (SUTURE) ×2
SUT SILK 2-0 18XBRD TIE BLK (SUTURE) ×1 IMPLANT
SUT SILK 3 0 SH 30 (SUTURE) IMPLANT
SUT VIC AB 2-0 CT1 27 (SUTURE)
SUT VIC AB 2-0 CT1 TAPERPNT 27 (SUTURE) IMPLANT
SUT VIC AB 2-0 SH 27 (SUTURE) ×2
SUT VIC AB 2-0 SH 27XBRD (SUTURE) ×1 IMPLANT
SUT VIC AB 3-0 54X BRD REEL (SUTURE) IMPLANT
SUT VIC AB 3-0 BRD 54 (SUTURE)
SUT VIC AB 3-0 FS2 27 (SUTURE) IMPLANT
SUT VIC AB 3-0 SH 27 (SUTURE) ×2
SUT VIC AB 3-0 SH 27X BRD (SUTURE) ×1 IMPLANT
SUT VICRYL 3-0 CR8 SH (SUTURE) IMPLANT
SYR 10ML LL (SYRINGE) ×2 IMPLANT
SYR 20CC LL (SYRINGE) ×2 IMPLANT
TOWEL OR NON WOVEN STRL DISP B (DISPOSABLE) ×2 IMPLANT
TRAY DSU PREP LF (CUSTOM PROCEDURE TRAY) ×2 IMPLANT
TUBE CONNECTING 20X1/4 (TUBING) ×2 IMPLANT
YANKAUER SUCT BULB TIP NO VENT (SUCTIONS) ×2 IMPLANT

## 2017-03-11 NOTE — Discharge Instructions (Signed)
**1,000mg  tylenol given at 1:15pm**    [facility] _______[facility]   INGUINAL HERNIA REPAIR: POST OP INSTRUCTIONS  Always review your discharge instruction sheet given to you by the facility where your surgery was performed. IF YOU HAVE DISABILITY OR FAMILY LEAVE FORMS, YOU MUST BRING THEM TO THE OFFICE FOR PROCESSING.   DO NOT GIVE THEM TO YOUR DOCTOR.  1. A  prescription for pain medication may be given to you upon discharge.  Take your pain medication as prescribed, if needed.  If narcotic pain medicine is not needed, then you may take acetaminophen (Tylenol) or ibuprofen (Advil) as needed. 2. Take your usually prescribed medications unless otherwise directed. If you need a refill on your pain medication, please contact your pharmacy.  They will contact our office to request authorization. Prescriptions will not be filled after 5 pm or on week-ends. 3. You should follow a light diet the first 24 hours after arrival home, such as soup and crackers, etc.  Be sure to include lots of fluids daily.  Resume your normal diet the day after surgery. 4.Most patients will experience some swelling and bruising  in the groin and scrotum.  Ice packs and reclining will help.  Swelling and bruising can take several days to resolve.  6. It is common to experience some constipation if taking pain medication after surgery.  Increasing fluid intake and taking a stool softener (such as Colace) will usually help or prevent this problem from occurring.  A mild laxative (Milk of Magnesia or Miralax) should be taken according to package directions if there are no bowel movements after 48 hours. 7. Unless discharge instructions indicate otherwise, you may remove your bandages 24-48 hours after surgery, and you may shower at that time.  You may have steri-strips (small skin tapes) in place directly over the incision.  These strips should be left on the skin for 7-10 days.  If your surgeon used skin glue on the  incision, you may shower in 24 hours.  The glue will flake off over the next 2-3 weeks.  Any sutures or staples will be removed at the office during your follow-up visit. 8. ACTIVITIES:  You may resume regular (light) daily activities beginning the next day--such as daily self-care, walking, climbing stairs--gradually increasing activities as tolerated.  You may have sexual intercourse when it is comfortable.  Refrain from any heavy lifting or straining until approved by your doctor.  a.You may drive when you are no longer taking prescription pain medication, you can comfortably wear a seatbelt, and you can safely maneuver your car and apply brakes. b.RETURN TO WORK:   _____________________________________________  9.You should see your doctor in the office for a follow-up appointment approximately 2-3 weeks after your surgery.  Make sure that you call for this appointment within a day or two after you arrive home to insure a convenient appointment time. 10.OTHER INSTRUCTIONS: _________________________    _____________________________________  WHEN TO CALL YOUR DOCTOR: 1. Fever over 101.0 2. Inability to urinate 3. Nausea and/or vomiting 4. Extreme swelling or bruising 5. Continued bleeding from incision. 6. Increased pain, redness, or drainage from the incision  The clinic staff is available to answer your questions during regular business hours.  Please dont hesitate to call and ask to speak to one of the nurses for clinical concerns.  If you have a medical emergency, go to the nearest emergency room or call 911.  A surgeon from [facility] is always on call at  the hospital   [address], [address], [place], [place]  [address] ?  [address] [address], [place], [place]   [address] [phone] ? [phone] ? FAX [phone] Web site: [email or web address]   Post Anesthesia Home Care Instructions  Activity: Get plenty of rest for the remainder of the day. A  responsible individual must stay with you for 24 hours following the procedure.  For the next 24 hours, DO NOT: -Drive a car -Advertising copywriterperate machinery -Drink alcoholic beverages -Take any medication unless instructed by your physician -Make any legal decisions or sign important papers.  Meals: Start with liquid foods such as gelatin or soup. Progress to regular foods as tolerated. Avoid greasy, spicy, heavy foods. If nausea and/or vomiting occur, drink only clear liquids until the nausea and/or vomiting subsides. Call your physician if vomiting continues.  Special Instructions/Symptoms: Your throat may feel dry or sore from the anesthesia or the breathing tube placed in your throat during surgery. If this causes discomfort, gargle with warm salt water. The discomfort should disappear within 24 hours.  If you had a scopolamine patch placed behind your ear for the management of post- operative nausea and/or vomiting:  1. The medication in the patch is effective for 72 hours, after which it should be removed.  Wrap patch in a tissue and discard in the trash. Wash hands thoroughly with soap and water. 2. You may remove the patch earlier than 72 hours if you experience unpleasant side effects which may include dry mouth, dizziness or visual disturbances. 3. Avoid touching the patch. Wash your hands with soap and water after contact with the patch.   Information for Discharge Teaching: EXPAREL (bupivacaine liposome injectable suspension)   Your surgeon gave you EXPAREL(bupivacaine) in your surgical incision to help control your pain after surgery.   EXPAREL is a local anesthetic that provides pain relief by numbing the tissue around the surgical site.  EXPAREL is designed to release pain medication over time and can control pain for up to 72 hours.  Depending on how you respond to EXPAREL, you may require less pain medication during your recovery.  Possible side effects:  Temporary loss of  sensation or ability to move in the area where bupivacaine was injected.  Nausea, vomiting, constipation  Rarely, numbness and tingling in your mouth or lips, lightheadedness, or anxiety may occur.  Call your doctor right away if you think you may be experiencing any of these sensations, or if you have other questions regarding possible side effects.  Follow all other discharge instructions given to you by your surgeon or nurse. Eat a healthy diet and drink plenty of water or other fluids.  If you return to the hospital for any reason within 96 hours following the administration of EXPAREL, please inform your health care providers.

## 2017-03-11 NOTE — Anesthesia Preprocedure Evaluation (Addendum)
Anesthesia Evaluation  Patient identified by MRN, date of birth, ID band Patient awake    Reviewed: Allergy & Precautions, NPO status , Patient's Chart, lab work & pertinent test results  Airway Mallampati: IV  TM Distance: <3 FB Neck ROM: Full  Mouth opening: Limited Mouth Opening  Dental  (+) Teeth Intact, Dental Advisory Given   Pulmonary neg pulmonary ROS,    breath sounds clear to auscultation       Cardiovascular negative cardio ROS   Rhythm:Regular Rate:Normal     Neuro/Psych PSYCHIATRIC DISORDERS Depression negative neurological ROS     GI/Hepatic negative GI ROS, Neg liver ROS,   Endo/Other  diabetes, Type 2, Insulin DependentHypothyroidism   Renal/GU negative Renal ROS  negative genitourinary   Musculoskeletal negative musculoskeletal ROS (+)   Abdominal   Peds  Hematology negative hematology ROS (+)   Anesthesia Other Findings   Reproductive/Obstetrics negative OB ROS                           Anesthesia Physical Anesthesia Plan  ASA: II  Anesthesia Plan: General   Post-op Pain Management:  Regional for Post-op pain   Induction: Intravenous  PONV Risk Score and Plan: 3 and Ondansetron, Dexamethasone and Midazolam  Airway Management Planned: Oral ETT  Additional Equipment: None  Intra-op Plan:   Post-operative Plan: Extubation in OR  Informed Consent: I have reviewed the patients History and Physical, chart, labs and discussed the procedure including the risks, benefits and alternatives for the proposed anesthesia with the patient or authorized representative who has indicated his/her understanding and acceptance.   Dental advisory given  Plan Discussed with: CRNA  Anesthesia Plan Comments: (Possible Glidescope. )      Anesthesia Quick Evaluation

## 2017-03-11 NOTE — Anesthesia Procedure Notes (Signed)
Procedure Name: Intubation Date/Time: 03/11/2017 1:48 PM Performed by: Signe Colt, CRNA Pre-anesthesia Checklist: Patient identified, Emergency Drugs available, Suction available and Patient being monitored Patient Re-evaluated:Patient Re-evaluated prior to induction Oxygen Delivery Method: Circle system utilized Preoxygenation: Pre-oxygenation with 100% oxygen Induction Type: IV induction Ventilation: Mask ventilation without difficulty Laryngoscope Size: Mac and 3 Grade View: Grade III Tube type: Oral Tube size: 7.0 mm Number of attempts: 1 Airway Equipment and Method: Stylet and Oral airway Placement Confirmation: ETT inserted through vocal cords under direct vision,  positive ETCO2 and breath sounds checked- equal and bilateral Secured at: 21 cm Tube secured with: Tape Dental Injury: Teeth and Oropharynx as per pre-operative assessment  Difficulty Due To: Difficulty was anticipated and Difficult Airway- due to limited oral opening

## 2017-03-11 NOTE — Progress Notes (Signed)
Assisted Dr. Hollis with right, ultrasound guided, transabdominal plane block. Side rails up, monitors on throughout procedure. See vital signs in flow sheet. Tolerated Procedure well.  

## 2017-03-11 NOTE — Anesthesia Procedure Notes (Signed)
Anesthesia Regional Block: TAP block   Pre-Anesthetic Checklist: ,, timeout performed, Correct Patient, Correct Site, Correct Laterality, Correct Procedure, Correct Position, site marked, Risks and benefits discussed,  Surgical consent,  Pre-op evaluation,  At surgeon's request and post-op pain management  Laterality: Right  Prep: chloraprep       Needles:  Injection technique: Single-shot  Needle Type: Echogenic Needle     Needle Length: 9cm  Needle Gauge: 21     Additional Needles:   Procedures:,,,, ultrasound used (permanent image in chart),,,,  Narrative:  Start time: 03/11/2017 1:30 PM End time: 03/11/2017 1:40 PM Injection made incrementally with aspirations every 5 mL.  Performed by: Personally  Anesthesiologist: Shelton SilvasHollis, Mikahla Wisor D, MD  Additional Notes: Patient tolerated the procedure well. Local anesthetic introduced in an incremental fashion under minimal resistance after negative aspirations. No paresthesias were elicited. After completion of the procedure, no acute issues were identified and patient continued to be monitored by RN.

## 2017-03-11 NOTE — Transfer of Care (Signed)
Immediate Anesthesia Transfer of Care Note  Patient: Doree AlbeeRichard L Vogelsang  Procedure(s) Performed: HERNIA REPAIR INGUINAL ERAS PATWAY (Right Groin) INSERTION OF MESH (Right Groin)  Patient Location: PACU  Anesthesia Type:GA combined with regional for post-op pain  Level of Consciousness: awake and patient cooperative  Airway & Oxygen Therapy: Patient Spontanous Breathing and Patient connected to face mask oxygen  Post-op Assessment: Report given to RN and Post -op Vital signs reviewed and stable  Post vital signs: Reviewed and stable  Last Vitals:  Vitals:   03/11/17 1336 03/11/17 1527  BP:  118/70  Pulse:  78  Resp: 15 17  Temp:    SpO2: (!) 84% 98%    Last Pain:  Vitals:   03/11/17 1253  TempSrc: Oral         Complications: No apparent anesthesia complications

## 2017-03-11 NOTE — Interval H&P Note (Signed)
History and Physical Interval Note:  03/11/2017 12:56 PM  Johnathan Estrada  has presented today for surgery, with the diagnosis of right inguinal hernia  The various methods of treatment have been discussed with the patient and family. After consideration of risks, benefits and other options for treatment, the patient has consented to  Procedure(s): HERNIA REPAIR INGUINAL ERAS PATWAY (Right) INSERTION OF MESH (Right) as a surgical intervention .  The patient's history has been reviewed, patient examined, no change in status, stable for surgery.  I have reviewed the patient's chart and labs.  Questions were answered to the patient's satisfaction.     Ernestene MentionHaywood M Daray Polgar

## 2017-03-11 NOTE — Op Note (Signed)
Patient Name:           Johnathan AlbeeRichard L Estrada   Date of Surgery:        03/11/2017  Pre op Diagnosis:      Right inguinal hernia  Post op Diagnosis:    Indirect right inguinal hernia  Procedure:                 Open repair right inguinal hernia with meshArmanda Heritage( Lichtenstein repair)  Surgeon:                     Angelia MouldHaywood M. Derrell LollingIngram, M.D., FACS  Assistant:                      OR staff  Operative Indications:    This is a nice 61 year old gentleman referred by Dr. Joselyn ArrowEve Knapp for evaluation of a symptomatic right inguinal hernia. Dr. Leslie DalesAltheimer is his endocrinologist.     He has a history of a left inguinal hernia repair 16 or 18 years ago here in town. He's done well with that. He now has a 1-2 year history of recurrent progressive bulge in his right groin. This getting larger. It is now causing pain. It has been reducible. He wants something done about that.      He has insulin-dependent diabetes mellitus. .      At his request we will proceed with scheduling of open repair of right internal hernia with mesh.  He agrees with this plan.    Operative Findings:       He had a moderately large, direct right inguinal hernia the size of a lemon.  He did not have any evidence of indirect hernia.  Procedure in Detail:         The patient had 8T AP be block on the right side by the anesthesiologist.  In the operating room I supplemented this with a 50% solution of per L, utilizing 30 mL in the deep and superficial tissues.     He was brought the operating room.  General endotracheal anesthesia was induced.  The abdomen and groin and genitalia were prepped and draped in a sterile fashion.  Surgical timeout was performed.  Intravenous antibiotics were given.     A transverse incision was made in the right groin overlying the inguinal canal.  Dissection was carried down to the external oblique, identifying the external inguinal ring.  The external oblique was incised in the direction of its fibers, opening up  the external inguinal ring.  The external oblique was dissected away from the underlying structures and self-retaining retractors were placed.  The cord structures were mobilized and encircled with a Penrose drain.  The ilioinguinal nerve was traced back to its emergence from the muscles laterally, clamped, divided, and ligated with a 2-0 silk tie.  The redundant nerve medially was resected.  Cremasteric muscle fibers were skeletonized.  A large direct bulge was dissected away from the cord structures.  Once this was thoroughly dissected it was reduced and then oversewn with a running suture of 2-0 Vicryl which  held it in place.  Careful dissection of the cord structures reveals no indirect hernia.  When I lifted up the cord structures I could see the edge of the peritoneum.  The floor of the inguinal canal was repaired and reinforced with a 3 x 6" piece of ultra Pro mesh.  The mesh was trimmed the corners to accommodate the anatomy of the wound.  The  mesh was sutured in place with running sutures of 2-0 Prolene and interrupted mattress sutures of 2-0 Prolene.  The mesh was sutured so as to generously overlap the fascia at the pubic tubercle, then along the inguinal ligament inferiorly.  Medially, superiorly, and superiolaterally several mattress sutures of 2-0 Prolene were placed.  The mesh was incised laterally so as to wraparound the cord structures at the internal ring.  The tails were of the mesh were overlapped laterally and further Prolene sutures were placed.  This provided very secure coverage and repair both medial and lateral to the internal ring but allowed a small fingertip opening for the cord structures.  Hemostasis was excellent.  The wound was irrigated.  The external oblique was closed with a running suture of 2-0 Vicryl placed  the cord structures deep to the external oblique.  Scarpa's fascia was closed with 3-0 Vicryl sutures and the skin closed with a running subcuticular 4-0 Monocryl and  Dermabond.  Patient tolerated the procedure well was taken to PACU in stable condition.  EBL 10 mL.  Counts correct.  Complications none.     Angelia MouldHaywood M. Derrell LollingIngram, M.D., FACS General and Minimally Invasive Surgery Breast and Colorectal Surgery   Addendum: I logged onto the [facility] website and reviewed his prescription medication history  03/11/2017 3:21 PM

## 2017-03-12 ENCOUNTER — Encounter (HOSPITAL_BASED_OUTPATIENT_CLINIC_OR_DEPARTMENT_OTHER): Payer: Self-pay | Admitting: General Surgery

## 2017-03-12 NOTE — Anesthesia Postprocedure Evaluation (Signed)
Anesthesia Post Note  Patient: Johnathan AlbeeRichard L Estrada  Procedure(s) Performed: HERNIA REPAIR INGUINAL ERAS PATWAY (Right Groin) INSERTION OF MESH (Right Groin)     Patient location during evaluation: PACU Anesthesia Type: General Level of consciousness: awake and alert Pain management: pain level controlled Vital Signs Assessment: post-procedure vital signs reviewed and stable Respiratory status: spontaneous breathing, nonlabored ventilation, respiratory function stable and patient connected to nasal cannula oxygen Cardiovascular status: blood pressure returned to baseline and stable Postop Assessment: no apparent nausea or vomiting Anesthetic complications: no    Last Vitals:  Vitals:   03/11/17 1630 03/11/17 1655  BP: 130/88 128/84  Pulse: 78 77  Resp: 20 18  Temp:  36.6 C  SpO2: 96% 97%    Last Pain:  Vitals:   03/11/17 1253  TempSrc: Oral                 Phillipe Clemon S

## 2017-11-13 IMAGING — DX DG TOE GREAT 2+V*R*
4 series · 4 of 4 positions shown · non-contrast
Comparison: None.

CLINICAL DATA: Possible foreign body after injury.

EXAM:
RIGHT GREAT TOE

[toe ap (1 of 2)]
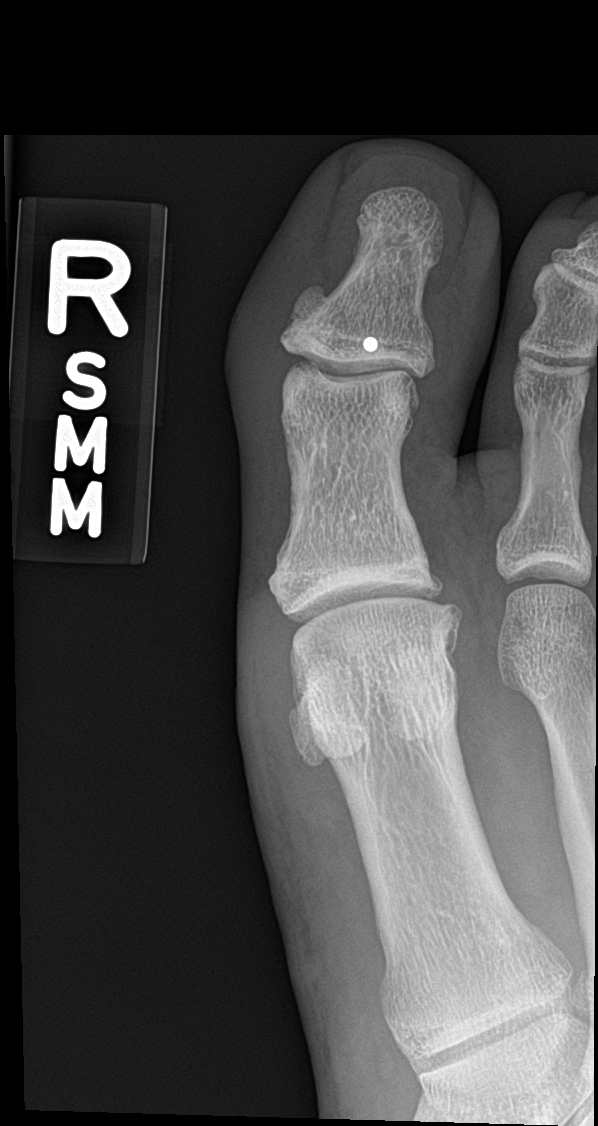

[toe obl]
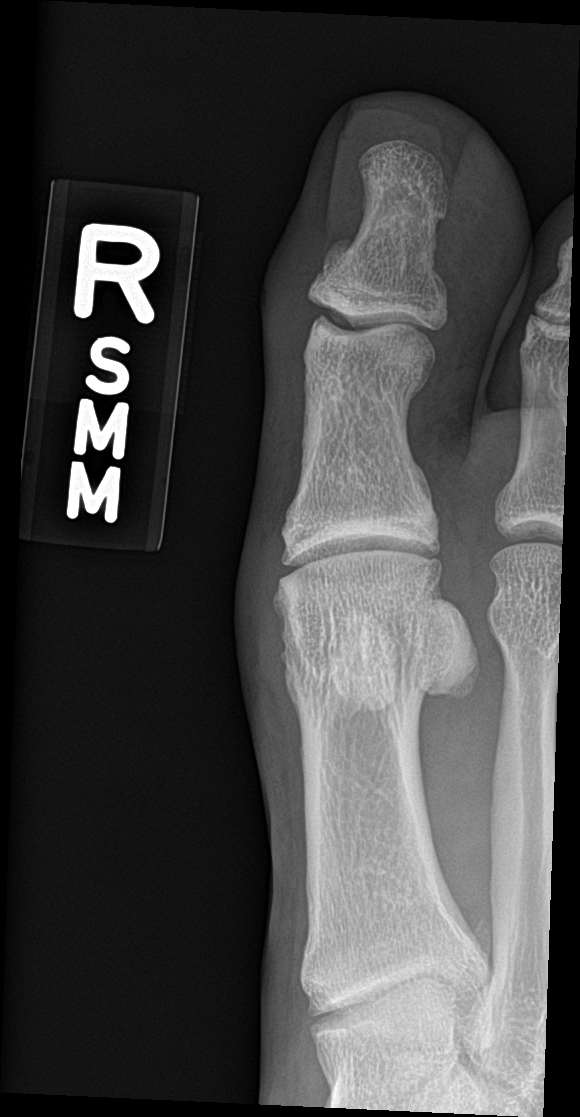

[toe lat]
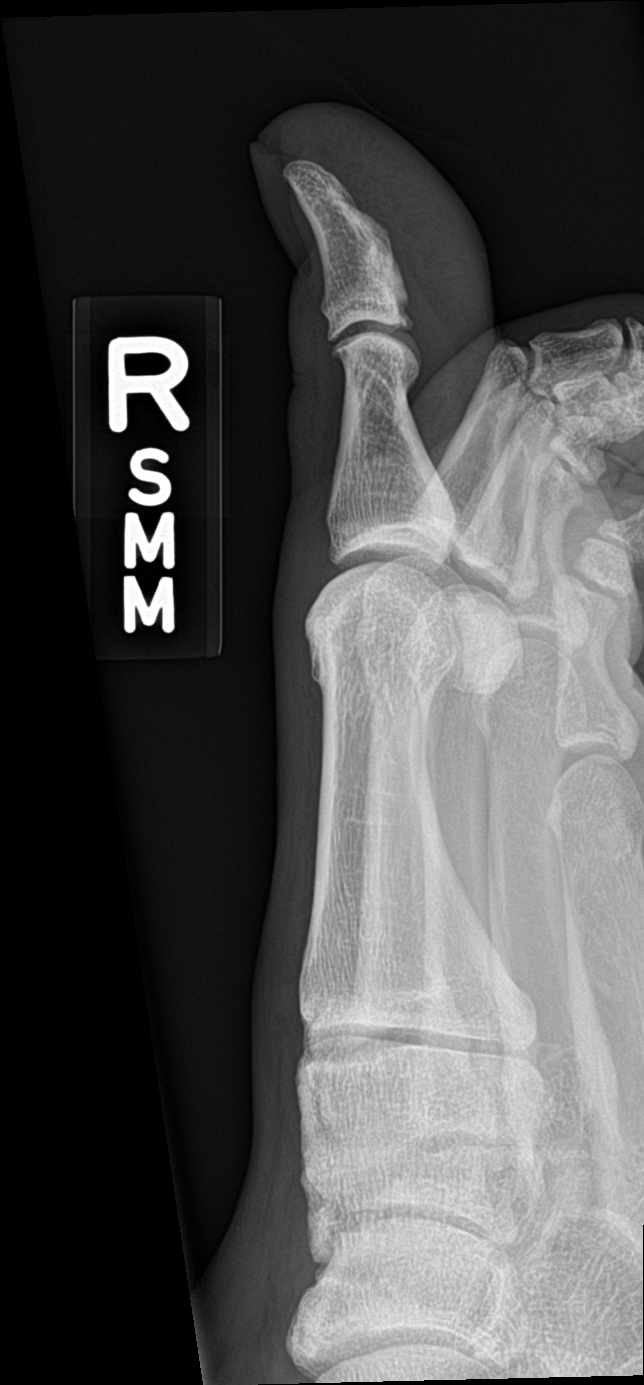

[toe ap (2 of 2)]
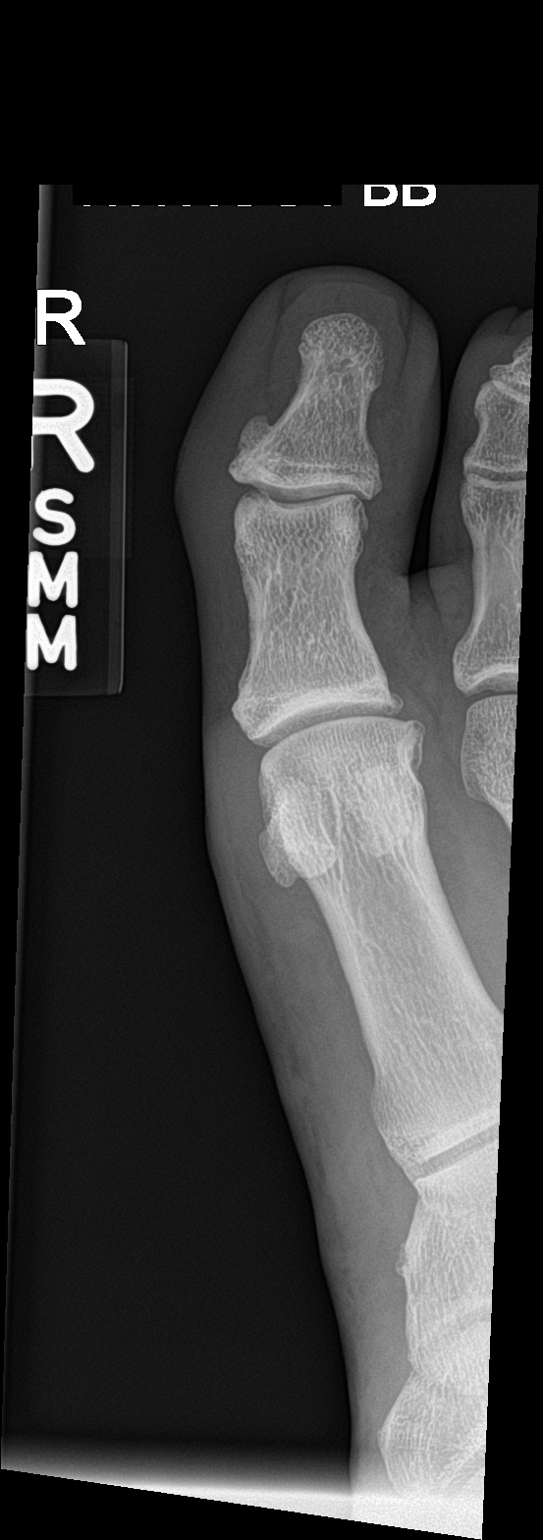

[4 of 4 positions shown; findings below may reference images not displayed]

FINDINGS: There is no evidence of fracture or dislocation. There is no
evidence of arthropathy or other focal bone abnormality. Soft
tissues are unremarkable. No radiopaque foreign body is noted.
IMPRESSION: No fracture or dislocation is noted. No radiopaque foreign body is
noted.

## 2017-11-14 ENCOUNTER — Telehealth: Payer: Self-pay | Admitting: *Deleted

## 2017-11-14 NOTE — Telephone Encounter (Signed)
Patient called to schedule appt for possible depression. While on phone with Olegario MessierKathy he told her that if a bus was coming at him head on he wouldn't go out of his way to steer out of the way. She transferred call to me. I did speak with patient and confirmed that he did not want to harm himself in anyway. He said that he was feeling stressed and depressed due to taking care of his mother and 100% promises that he will not doing anything to harm himself and he can and will make it til his appt on Monday. He no longer sees Dr.Kaur.

## 2017-11-17 ENCOUNTER — Encounter: Payer: Self-pay | Admitting: Family Medicine

## 2017-11-17 ENCOUNTER — Ambulatory Visit (INDEPENDENT_AMBULATORY_CARE_PROVIDER_SITE_OTHER): Payer: BLUE CROSS/BLUE SHIELD | Admitting: Family Medicine

## 2017-11-17 VITALS — BP 130/80 | HR 80 | Ht 71.0 in | Wt 192.4 lb

## 2017-11-17 DIAGNOSIS — Z794 Long term (current) use of insulin: Secondary | ICD-10-CM

## 2017-11-17 DIAGNOSIS — E039 Hypothyroidism, unspecified: Secondary | ICD-10-CM | POA: Diagnosis not present

## 2017-11-17 DIAGNOSIS — E119 Type 2 diabetes mellitus without complications: Secondary | ICD-10-CM

## 2017-11-17 DIAGNOSIS — F331 Major depressive disorder, recurrent, moderate: Secondary | ICD-10-CM

## 2017-11-17 DIAGNOSIS — Z636 Dependent relative needing care at home: Secondary | ICD-10-CM

## 2017-11-17 NOTE — Patient Instructions (Signed)
  I recommend that you see a therapist.  Vernell LeepKen Frazier is a therapist on [address] whom I've heard good things about. He can work with you on dealing with coping mechanisms, stress reduction. If this is not affordable you can look into other options [place] [facility] [place] (ie [address], [facility] [place] You would benefit from being back on a medication for your depression  Ask about contacting [facility] for any help for your mother (through her doctors/insurance).  [facility].  You are past due to see Dr. Leslie DalesAltheimer. Please schedule. If diabetes is controlled, he likely will want to see you every 6 months. He has been monitoring your thyroid--last check in November was normal.  You are due for a complete physical with me.

## 2017-11-17 NOTE — Progress Notes (Signed)
Chief Complaint  Patient presents with  . Depression    possible depression.     Feels stressed, overwhelmed. Related to caring for his mother who sounds like has multi-infarct dementia. He quit his job to care for her.  Sometimes he gets so angry he sees stars. He currently is on [employer], but is expensive, and will be looking for cheaper alternatives, may not have insurance for a while.  Recently he has had a feeling in his head, hard to describe, not painful, but he feels like he needs to lay down.  Not a headache, energy isn't zapped, but head is "somewhere else and he has no energy".  This has only been going on for a couple of weeks, but today he feels much better. ?if related to the heat (cooler today).  Sometimes he feels like he might almost pass out. He doesn't think it is sugar related.  Feels different than hypoglycemia.  Sugar was 70 yesterday, ate, came up to 95 but he didn't feel any better after eating.  Sugars are mostly 89-105.  Has 2 dogs, one is "the best thing that ever happened to me", got her from the pound.  Previously saw Dr. Evelene Croon. Tried on many different psychiatric medications for depression, doesn't recall any of them being particularly helpful.  Recalls some medicines made him sleepy. Last medicaiton was trintellix (per what was listed in his chart, he can't recall).  He still has some xanax at home if needed for anxiety, rarely uses.  He reports always seeming to find people that are "broken", and realizes that they can't be fixed. He realizes that he is always one to help others (such as extended family members, where their own first degree relatives did not help).  Hypothyroidism:  He has been compliant with taking his Synthroid since being under the care of Dr. Leslie Dales.  TSH 2.2 the end of 01/2017.  DM: reports compliance with his medications, sugars have been pretty good, no significant hypoglycemia, no polydipsia, polyuria.  He is unaware how often he  is supposed to be seen at his various doctors.  Last visit with Dr. Leslie Dales was 01/2017, doing well.  PMH, PSH, SH reviewed  Outpatient Encounter Medications as of 11/17/2017  Medication Sig Note  . glucose blood test strip Use as instructed   . Insulin Glargine (BASAGLAR KWIKPEN) 100 UNIT/ML SOPN Take 10 units SQ once daily, and increase by 3 units every 3-4 days as directed (Patient taking differently: 20 Units. Take 10 units SQ once daily, and increase by 3 units every 3-4 days as directed) 09/20/2015: Takes 20 Units daily  . Insulin Pen Needle 32G X 4 MM MISC 1 each by Does not apply route 2 (two) times daily.   . Lancets (ONETOUCH ULTRASOFT) lancets Use as instructed   . Liraglutide (VICTOZA) 18 MG/3ML SOPN Inject 1.8 mg into the skin daily.    . simvastatin (ZOCOR) 10 MG tablet Take 10 mg by mouth daily.   Marland Kitchen SYNTHROID 125 MCG tablet Take 1 tablet (125 mcg total) by mouth daily before breakfast.   . ibuprofen (ADVIL,MOTRIN) 200 MG tablet Take 800 mg by mouth every 6 (six) hours as needed. Reported on 09/20/2015 06/14/2015: Uses prn, infrequently  . [DISCONTINUED] ALPRAZolam (XANAX) 0.5 MG tablet Reported on 09/20/2015 06/19/2015: Has only taken one tablet (not once daily as previously written)  . [DISCONTINUED] diazepam (VALIUM) 10 MG tablet Take 10 mg by mouth every 6 (six) hours as needed.  03/04/2016: Received from:  External Pharmacy  . [DISCONTINUED] HYDROcodone-acetaminophen (NORCO) 5-325 MG tablet Take 1-2 tablets by mouth every 6 (six) hours as needed for moderate pain or severe pain. (Patient not taking: Reported on 11/17/2017)   . [DISCONTINUED] VIAGRA 100 MG tablet  03/04/2016: Received from: External Pharmacy  . [DISCONTINUED] vortioxetine HBr (TRINTELLIX) 10 MG TABS Take by mouth.    No facility-administered encounter medications on file as of 11/17/2017.    Vitamin D, taking supplement daily, unsure of dose.  No Known Allergies  ROS:  No true headaches, just symptoms as  described per HPI.  No numbness, tingling, syncope, chest pain. +gets angered easily, overwhelmed, frustrated, sad/depressed.  No changes to weight/hair/skin/nail/bowels, denies bleeding, bruising, rash or other concerns, except as noted in HPI.  PHYSICAL EXAM:  BP 130/80   Pulse 80   Ht 5\' 11"  (1.803 m)   Wt 192 lb 6.4 oz (87.3 kg)   BMI 26.83 kg/m   Wt Readings from Last 3 Encounters:  11/17/17 192 lb 6.4 oz (87.3 kg)  03/11/17 182 lb (82.6 kg)  07/29/16 187 lb 6.4 oz (85 kg)   Well appearing male, who actually appears better to me today than at many prior visits (due to him being compliant with his thyroid medication).  Appears to have more energy, speaking normally (previously slow).  He has normal eye contact, speech. He has normal cranial nerves. He sounds logical, has some insight. He is depressed, but slightly flattened affect.  At one point only did he become tearful, when talking about how his dog as helped him ("saved his life"). HEENT conjunctiva and sclera are clear, EOMI. Neck: no lymphadenopathy, thyromegaly or mass Heart: regular rate and rhythm Lungs: clear bilaterally Extremities: no edema Neuro: alert and oriented, cranial nerves intact, normal gait  PHQ-9 score of 16. See full screen in epic. While he thinks he might be better off dead, he denies any plan or actually wanting to hurt himself.   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 Encouraged counseling/support groups, f/u with psych. contracts for safety  Controlled type 2 diabetes mellitus without complication, with long-term current use of insulin (HCC)  Hypothyroidism, unspecified type  He has been compliant with taking his meds, and per last labs (and per pt  reports of sugars), diabetes and thyroid seem to be well controlled. Advised that he likely should be seeing Dr. Leslie DalesAltheimer q6 mos (if controlled), and is past due, encouraged to schedule f/u, due for labs (fasting).  He reports his diet has changed, eating a lot more beef, not really sure how to cook other foods.  May need bump in statin dose if LDL not at goal.  Pt understands to call Dr. Leslie DalesAltheimer for f/u (and that he will be due for fasting labs).    I recommend that you see a therapist.  Vernell LeepKen Frazier is a therapist on [address] whom I've heard good things about. He can work with you on dealing with coping mechanisms, stress reduction. If this is not affordable you can look into other options [facility] [place] (ie [address], [facility] [place] You would benefit from being back on a medication for your depression  Ask about contacting [facility] for any help for your mother (through her doctors/insurance).  [facility].  You are past  due to see Dr. Leslie Dales. Please schedule. If diabetes is controlled, he likely will want to see you every 6 months. He has been monitoring your thyroid--last check in November was normal.  You are due for a complete physical with me.
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
## 2017-11-19 NOTE — Patient Instructions (Addendum)
  HEALTH MAINTENANCE RECOMMENDATIONS:  It is recommended that you get at least 30 minutes of aerobic exercise at least 5 days/week (for weight loss, you may need as much as 60-90 minutes). This can be any activity that gets your heart rate up. This can be divided in 10-15 minute intervals if needed, but try and build up your endurance at least once a week.  Weight bearing exercise is also recommended twice weekly.  Eat a healthy diet with lots of vegetables, fruits and fiber.  "Colorful" foods have a lot of vitamins (ie green vegetables, tomatoes, red peppers, etc).  Limit sweet tea, regular sodas and alcoholic beverages, all of which has a lot of calories and sugar.  Up to 2 alcoholic drinks daily may be beneficial for men (unless trying to lose weight, watch sugars).  Drink a lot of water.  Sunscreen of at least SPF 30 should be used on all sun-exposed parts of the skin when outside between the hours of 10 am and 4 pm (not just when at beach or pool, but even with exercise, golf, tennis, and yard work!)  Use a sunscreen that says "broad spectrum" so it covers both UVA and UVB rays, and make sure to reapply every 1-2 hours.  Remember to change the batteries in your smoke detectors when changing your clock times in the spring and fall.  Use your seat belt every time you are in a car, and please drive safely and not be distracted with cell phones and texting while driving.  Carbon monoxide detectors are recommended for your home.  You are due for colonoscopy.  Please contact [facility] to schedule a visit with Dr. Bosie ClosSchooler.  prevnar-13 is recommended due to having diabetes, putting you at higher risk for pneumonia. This was given today, along with a flu shot. You will receive pneumovax (another pneumonia vaccine) when you turn 65.  I recommend getting the new shingles vaccine (Shingrix). You will need to check with your insurance to see if it is covered, and if covered, you can schedule a nurse visit  at our office (if/when it is available). It is a series of 2 injections, spaced 2 months apart. We discussed the potential side effects today (arm pain, possible flu-like symptoms).  We are referring you for another sleep study test (hopefully to be done at home, if covered by insurance).

## 2017-11-20 ENCOUNTER — Encounter: Payer: Self-pay | Admitting: Family Medicine

## 2017-11-20 ENCOUNTER — Ambulatory Visit (INDEPENDENT_AMBULATORY_CARE_PROVIDER_SITE_OTHER): Payer: BLUE CROSS/BLUE SHIELD | Admitting: Family Medicine

## 2017-11-20 VITALS — BP 124/82 | HR 76 | Temp 99.2°F | Ht 69.0 in | Wt 191.2 lb

## 2017-11-20 DIAGNOSIS — G478 Other sleep disorders: Secondary | ICD-10-CM

## 2017-11-20 DIAGNOSIS — F331 Major depressive disorder, recurrent, moderate: Secondary | ICD-10-CM | POA: Diagnosis not present

## 2017-11-20 DIAGNOSIS — Z794 Long term (current) use of insulin: Secondary | ICD-10-CM | POA: Diagnosis not present

## 2017-11-20 DIAGNOSIS — Z23 Encounter for immunization: Secondary | ICD-10-CM

## 2017-11-20 DIAGNOSIS — Z0000 Encounter for general adult medical examination without abnormal findings: Secondary | ICD-10-CM

## 2017-11-20 DIAGNOSIS — E039 Hypothyroidism, unspecified: Secondary | ICD-10-CM

## 2017-11-20 DIAGNOSIS — E119 Type 2 diabetes mellitus without complications: Secondary | ICD-10-CM | POA: Diagnosis not present

## 2017-11-20 DIAGNOSIS — R0683 Snoring: Secondary | ICD-10-CM

## 2017-11-20 DIAGNOSIS — E559 Vitamin D deficiency, unspecified: Secondary | ICD-10-CM

## 2017-11-20 DIAGNOSIS — Z5181 Encounter for therapeutic drug level monitoring: Secondary | ICD-10-CM

## 2017-11-21 ENCOUNTER — Other Ambulatory Visit: Payer: BLUE CROSS/BLUE SHIELD

## 2017-11-21 DIAGNOSIS — E039 Hypothyroidism, unspecified: Secondary | ICD-10-CM | POA: Diagnosis not present

## 2017-11-21 DIAGNOSIS — E1165 Type 2 diabetes mellitus with hyperglycemia: Secondary | ICD-10-CM | POA: Diagnosis not present

## 2017-11-21 DIAGNOSIS — E559 Vitamin D deficiency, unspecified: Secondary | ICD-10-CM | POA: Diagnosis not present

## 2017-11-21 DIAGNOSIS — Z794 Long term (current) use of insulin: Secondary | ICD-10-CM | POA: Diagnosis not present

## 2017-11-21 DIAGNOSIS — E782 Mixed hyperlipidemia: Secondary | ICD-10-CM | POA: Diagnosis not present

## 2017-11-22 ENCOUNTER — Encounter: Payer: Self-pay | Admitting: Family Medicine

## 2017-12-26 DIAGNOSIS — H43393 Other vitreous opacities, bilateral: Secondary | ICD-10-CM | POA: Diagnosis not present

## 2017-12-26 DIAGNOSIS — H2513 Age-related nuclear cataract, bilateral: Secondary | ICD-10-CM | POA: Diagnosis not present

## 2017-12-26 DIAGNOSIS — H40033 Anatomical narrow angle, bilateral: Secondary | ICD-10-CM | POA: Diagnosis not present

## 2018-02-17 DIAGNOSIS — E1165 Type 2 diabetes mellitus with hyperglycemia: Secondary | ICD-10-CM | POA: Diagnosis not present

## 2018-02-17 DIAGNOSIS — E559 Vitamin D deficiency, unspecified: Secondary | ICD-10-CM | POA: Diagnosis not present

## 2018-02-17 DIAGNOSIS — E039 Hypothyroidism, unspecified: Secondary | ICD-10-CM | POA: Diagnosis not present

## 2018-02-17 DIAGNOSIS — E782 Mixed hyperlipidemia: Secondary | ICD-10-CM | POA: Diagnosis not present

## 2018-02-18 DIAGNOSIS — E1165 Type 2 diabetes mellitus with hyperglycemia: Secondary | ICD-10-CM | POA: Diagnosis not present

## 2018-02-18 DIAGNOSIS — Z794 Long term (current) use of insulin: Secondary | ICD-10-CM | POA: Diagnosis not present

## 2018-02-18 DIAGNOSIS — E782 Mixed hyperlipidemia: Secondary | ICD-10-CM | POA: Diagnosis not present

## 2018-03-19 ENCOUNTER — Telehealth: Payer: Self-pay | Admitting: Family Medicine

## 2018-03-19 ENCOUNTER — Other Ambulatory Visit: Payer: Self-pay | Admitting: *Deleted

## 2018-03-19 MED ORDER — SILDENAFIL CITRATE 20 MG PO TABS: 20.0000 mg | ORAL_TABLET | Freq: Every day | ORAL | 0 refills | Status: DC

## 2018-03-19 NOTE — Telephone Encounter (Signed)
  Patient wants refill on Viagra(generic), states he probably has not had in 2 years (since last girlfriend) but [facility]  Pt would like to be notified if/when rx sent in

## 2018-03-19 NOTE — Telephone Encounter (Signed)
Chart reviewed--looks like he no showed his fasting labs the day after his physical. BUT, has since seen Dr. Leslie DalesAltheimer (and had labs)--will need to abstract his diabetic stuff.  I do not see where we ever prescribed Sildenafil.  I see that he had a prescription (historical, not rx'd by me) in 2017) for Viagra 100mg .  Generic is only available in 20mg , and isn't usually covered by insurance, there are cheaper cash pay prices from certain pharmacies.  He can explore this and let us know, but we have not prescribed this for him

## 2018-04-15 ENCOUNTER — Telehealth: Payer: Self-pay | Admitting: *Deleted

## 2018-04-15 MED ORDER — SILDENAFIL CITRATE 100 MG PO TABS: 100.0000 mg | ORAL_TABLET | Freq: Every day | ORAL | 0 refills | Status: DC | PRN

## 2018-04-15 NOTE — Telephone Encounter (Signed)
Left me a voicemail asking if you would send an rx to [facility] for him of the generic viagra 100mg  tablets please. And also was asking is there are any surgical options for sleep apnea as he really does not want to wear a CPAP-not that he has done the sleep study anyways.

## 2018-04-15 NOTE — Telephone Encounter (Signed)
They make 100mg  generic now?? News to me.  Okay for #30, no refill

## 2018-04-15 NOTE — Telephone Encounter (Signed)
In Dec that was for the 20mg  sildenafil-he wants the 100mg  this time.

## 2018-04-15 NOTE — Telephone Encounter (Signed)
Sent in and patient advised.

## 2018-04-15 NOTE — Telephone Encounter (Signed)
Last filled #30 the end of December--did he get those?  Okay to refill with 5 add'l refills. There are surgical options, very involved/complicated surgery, done by ENT. He needs to get the sleep study done before any treatment options can be decided.Marland KitchenMarland Kitchen

## 2018-04-15 NOTE — Telephone Encounter (Signed)
Patient will go ahead and go sleep study.

## 2018-05-13 DIAGNOSIS — E1165 Type 2 diabetes mellitus with hyperglycemia: Secondary | ICD-10-CM | POA: Diagnosis not present

## 2018-05-13 DIAGNOSIS — Z794 Long term (current) use of insulin: Secondary | ICD-10-CM | POA: Diagnosis not present

## 2018-05-13 DIAGNOSIS — E039 Hypothyroidism, unspecified: Secondary | ICD-10-CM | POA: Diagnosis not present

## 2018-05-13 DIAGNOSIS — E782 Mixed hyperlipidemia: Secondary | ICD-10-CM | POA: Diagnosis not present

## 2018-05-21 DIAGNOSIS — E039 Hypothyroidism, unspecified: Secondary | ICD-10-CM | POA: Diagnosis not present

## 2018-05-21 DIAGNOSIS — E782 Mixed hyperlipidemia: Secondary | ICD-10-CM | POA: Diagnosis not present

## 2018-05-21 DIAGNOSIS — E1165 Type 2 diabetes mellitus with hyperglycemia: Secondary | ICD-10-CM | POA: Diagnosis not present

## 2018-05-21 DIAGNOSIS — Z794 Long term (current) use of insulin: Secondary | ICD-10-CM | POA: Diagnosis not present

## 2018-07-15 ENCOUNTER — Encounter: Payer: BLUE CROSS/BLUE SHIELD | Admitting: Family Medicine

## 2018-10-09 ENCOUNTER — Telehealth: Payer: Self-pay | Admitting: Family Medicine

## 2018-10-09 NOTE — Telephone Encounter (Signed)
Please advise the patient that we do care about him--we are trying to get him the best care and not cost him more than it needs to.  We aren't saying we won't care for him.  He had asked me to manage all of his problems, including taking over his diabetes care, and I don't think that is a good idea.   We had suggested the clinics available for folks without insurance; often these other clinics have more available samples or people to help get medications paid for.  If he won't qualify for patient assistance to get his medications for free (which it sounds like he won't based on his message--he would have to provide the same information to the drug companies), then my recommendation is for him to follow up with Dr. Elyse Hsu. He can probably get him on a less expensive insulin regimen, that would be more affordable in the longterm.  I have little experience with some of those other insulins, and recommend that he continue to follow with Dr. Altheimer's office.  Hopefully they can work with him to get on a more affordable diabetes treatment regimen.

## 2018-10-09 NOTE — Telephone Encounter (Signed)
Forwarding to you :)

## 2018-10-09 NOTE — Telephone Encounter (Signed)
Pt was called and states he was informed to call [facility] because it was cheaper than coming to see [place], he states he called them and they wanted to know all of his assets and his income, and he told them to forget it cause he knew he wouldn't be able to go there because he had to many assets, pt was not happy he was told to go somewhere else, states he has money and Is well off and can pay for his office visits, he has had a hard time finding insurances because of his income, pt said that no one cares about him, and states that he has been having a hard time with getting his insulin because it is so expensive, pt also states that it if he (dies tomorrow he dies he is ready) pt canceled his appt for Monday,

## 2018-10-12 ENCOUNTER — Encounter: Payer: BLUE CROSS/BLUE SHIELD | Admitting: Family Medicine

## 2018-10-13 NOTE — Telephone Encounter (Signed)
Thank you for speaking with him.  Labs reviewed from endo.  Last A1c 04/2018 5.5 (endo prescribed basaglar 15 U, had been off victoza). His last TSH was elevated (9.219), and dose was increased to 154mg.  I'm happy to see him now for a med check (not this week, no time, needs 30 mins, next week is more open I believe). He has a LONG history of noncompliance with his thyroid medications.  Please make sure that he is taking those regularly. If he has run out, we can refill another 30 days.  As long as his sugars are better, we probably can manage with oral meds if A1c is high (which will be much more affordable, ie metformin).  He will need A1c, TSH, lipids and c-met. Please let him know the pricing on these labs. If he prefers to have these done prior to his visit so that we have the results to discuss at his visit, please send back to me to enter future orders.  This would be my preference (and we could potentially do virtual visit instead of in-office)  We will hold off on other labs until his physical or subsequent f/u visit  (is too soon for urine microalbumin)

## 2018-10-13 NOTE — Telephone Encounter (Signed)
Spoke with Carloyn Manner, explained to him that Dr Tomi Bamberger cares greatly for him.  And he said he knew that , that she had truly saved his life, and he has great respect for her.  I explained that he needed to continue to see Dr. Elyse Hsu for his diabetes.  Pt said he doesn't really think he has diabetes.  That when his numbers were over 580, he was drinking about 12 bottles of pepsi per day.  He has gone off his insulin, he is eating right and starting back to exercise.  His bs numbers are usually between 90 - 115.  Rather he takes the insulin or not.  He states he had asked Dr. Elyse Hsu about a different medication and Dr. Elyse Hsu is not interested in changing his meds.  Pt truly thinks he no longer is a diabetic.  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  It appears he is due for a CPE after 11/20/17 or a med check now, no insurance.

## 2018-10-14 NOTE — Telephone Encounter (Signed)
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  He asked that I call him again.  I requested he call us back, when he is free.

## 2018-11-05 ENCOUNTER — Ambulatory Visit: Payer: BLUE CROSS/BLUE SHIELD | Admitting: Internal Medicine

## 2018-12-14 ENCOUNTER — Telehealth: Payer: Self-pay | Admitting: Family Medicine

## 2018-12-14 DIAGNOSIS — E559 Vitamin D deficiency, unspecified: Secondary | ICD-10-CM

## 2018-12-14 DIAGNOSIS — Z5181 Encounter for therapeutic drug level monitoring: Secondary | ICD-10-CM

## 2018-12-14 DIAGNOSIS — E039 Hypothyroidism, unspecified: Secondary | ICD-10-CM

## 2018-12-14 DIAGNOSIS — E119 Type 2 diabetes mellitus without complications: Secondary | ICD-10-CM

## 2018-12-14 NOTE — Telephone Encounter (Signed)
Pt coming in for med check on Wednesday and I scheduled him for labs tomorrow but your note from July. Can you put orders in system, he will be fasting

## 2018-12-14 NOTE — Telephone Encounter (Signed)
Orders entered

## 2018-12-15 ENCOUNTER — Telehealth: Payer: Self-pay | Admitting: Family Medicine

## 2018-12-15 ENCOUNTER — Other Ambulatory Visit: Payer: Self-pay

## 2018-12-16 ENCOUNTER — Encounter: Payer: Self-pay | Admitting: Family Medicine

## 2019-01-07 NOTE — Telephone Encounter (Signed)
dt ?

## 2019-04-02 LAB — HEMOGLOBIN A1C: Hemoglobin A1C: 5.9

## 2019-04-05 ENCOUNTER — Telehealth: Payer: Self-pay | Admitting: Family Medicine

## 2019-04-05 NOTE — Telephone Encounter (Signed)
Called pt he has not taken any of his meds in 6 months.  He has had NO INSULIN.  He has taken generic Synthroid as he can afford that.  He talked about lots of different things, he is depressed, he doesn't believe in Covid.  He thinks mask are ridiculous and will not wear.  He has cleaned up his eating and his Blood sugar is rarely over 120.  He has a virtual appointment with Dr. Layne Benton this morning.  He will have a copy of those labs sent to [place]. He will call back to schedule and appointment with you.

## 2019-04-05 NOTE — Telephone Encounter (Signed)
I hope you advised him that we would not see him in the office without a mask on! Encourage him to f/u with Dr. Evelene Croon for his depression as well please.

## 2019-04-07 ENCOUNTER — Encounter: Payer: Self-pay | Admitting: *Deleted

## 2019-06-08 ENCOUNTER — Encounter: Payer: Self-pay | Admitting: Family Medicine

## 2019-06-14 MED FILL — LEVOTHYROXINE 137 MCG TAB: 137 | 90 days supply | Qty: 90 | Fill #0

## 2019-06-14 MED FILL — SIMVASTATIN 10 MG TABLET: 10 | 90 days supply | Qty: 90 | Fill #0

## 2019-07-09 LAB — LIPID PANEL: LDL Cholesterol: 103

## 2019-07-09 LAB — TSH: TSH: 2.76 (ref <=5.90)

## 2019-07-09 LAB — HEMOGLOBIN A1C: Hemoglobin A1C: 5.7

## 2019-10-08 MED FILL — LEVOTHYROXINE 137 MCG TAB: 137 | 90 days supply | Qty: 90 | Fill #0

## 2019-10-08 MED FILL — SIMVASTATIN 10 MG TABLET: 10 | 90 days supply | Qty: 90 | Fill #1

## 2019-12-17 ENCOUNTER — Telehealth: Payer: Self-pay | Admitting: Family Medicine

## 2019-12-17 NOTE — Telephone Encounter (Signed)
Pt called and would like to talk to you about COVID,  He has some questions.  He said that he trusts you more than anyone in the whole world and would like to talk to you.  He currently has no symptoms.  I advised him that it would be Monday before you were back in the office.  He said that was fine.

## 2019-12-17 NOTE — Telephone Encounter (Signed)
I haven't seen pt since 2019. I am happy to answer his questions and concerns, but should be in a visit.  Can be virtual.

## 2019-12-17 NOTE — Telephone Encounter (Signed)
Tried to call pt back and his mailbox is full

## 2019-12-28 NOTE — Telephone Encounter (Signed)
Pt informed, he will notify us when he wants to schedule appt

## 2020-01-21 MED FILL — LEVOTHYROXINE 137 MCG TAB: 137 | 90 days supply | Qty: 90 | Fill #1

## 2020-01-21 MED FILL — SIMVASTATIN 10 MG TABLET: 10 | 90 days supply | Qty: 90 | Fill #2

## 2020-03-01 LAB — HEMOGLOBIN A1C: Hemoglobin A1C: 6.3

## 2020-03-08 ENCOUNTER — Encounter: Payer: Self-pay | Admitting: Family Medicine

## 2020-03-08 ENCOUNTER — Telehealth: Payer: Self-pay | Admitting: *Deleted

## 2020-03-08 NOTE — Telephone Encounter (Signed)
Patient called and said he believes he is past due for colonoscopy. (per chart, last CPE his last colon was 7/09 Dr. Bosie Clos). He wants to have another done.

## 2020-03-08 NOTE — Telephone Encounter (Signed)
He should be able to contact them directly to schedule this.  It would be a good idea to have him come for a physical as well, to make sure there aren't other items needed (prostate check, etc). Let him know that we get Dr. Berline Lopes notes and we don't need to duplicate what he is managing--looks like he is doing well now.

## 2020-03-09 NOTE — Telephone Encounter (Signed)
Patient advised.

## 2020-07-06 LAB — LIPID PANEL: LDL Cholesterol: 103

## 2020-07-06 LAB — TSH: TSH: 23.77 — AB (ref <=5.90)

## 2020-07-06 LAB — HEMOGLOBIN A1C: Hemoglobin A1C: 6.3

## 2020-08-28 LAB — VITAMIN D 25 HYDROXY (VIT D DEFICIENCY, FRACTURES): Vit D, 25-Hydroxy: 34

## 2020-12-27 NOTE — Progress Notes (Deleted)
   Patient presents to re-establish care.  Last visit here was in 2019. He has only been under the care of Dr. Elyse Hsu (endocrinologist), who managed his diabetes and hypothyroidism, as he had [employer].  Dr. Elyse Hsu recently retired. He previously had a lot of stressors related to caring for his mother with dementia.  She passed away in 2018/02/24, and that, along with adopting 2 shelter dogs in early 2020, has helped his mood significantly.  He previously was under the care of Dr. Chucky May, but didn't find the medications helpful, and she no longer took his insurance.   Diabetes: He has controlling his diabetes with basal insulin alone.  Semglee (insulin glargine pen(SoloStar) 15-18 units daily.  MISSES DOSES? Last A1c was 6.3% in 4/22. Last screen for nephropathy that I could see was in 2019 through [facility]. Sugars are running  He denies polydipsia, polyuria, hypoglycemia, numbness or tingling. He checks his feet regularly and denies any numbness, burning, or lesions. Last eye exam was    Hypothyroidism:   Pt has long history of noncompliance with oral meds. Last TSH through endo was 23.77 in 06/2020, when taking 137 mcg of levothyroxine. It had been 4.117 in 02/2020. Per Dr. Conley Rolls notes, dose was increased to 133mcg in 08/2020, and is due to be rechecked.  Hyperlipidemia:  He has been prescribed simvastatin 10mg , but has had issues with noncompliance. TAKING?  06/2020: A1c 6.3% Normal c-met (glu 95), Vit D-OH 34.1, TSH 23.77 Lipids: TC 164, LDL 103, HDL 35, TG 165, chol/HDL ratio 4.7    PHYSICAL EXAM:  There were no vitals taken for this visit. Wt Readings from Last 3 Encounters:  11/20/17 191 lb 3.2 oz (86.7 kg)  11/17/17 192 lb 6.4 oz (87.3 kg)  03/11/17 182 lb (82.6 kg)      ASSESSMENT/PLAN:   DOES HE HAVE F/U SCHEDULED SOON WITH ENDO? (I know Al[facility] retired)--if so, we shouldn't do A1c. HAS HE HAD EYE EXAM? If so, we need to get report DOES HE ACTUALLY  TAKE SIMVASTATIN? LAST TSH VERY HIGH--HOW COMPLIANT IS HE WITH HIS SYNTHROID??  Dose supposed to be increased to 150, hasn't had TSH rechecked.  Flu shot Hasn't had primary COVID series, offer and decline if refuses  NEEDS URINE MICROALB/CR  Needs physical scheduled--turning 65 next week--will he be on Medicare????

## 2020-12-28 ENCOUNTER — Ambulatory Visit: Payer: Self-pay | Admitting: Family Medicine

## 2020-12-29 DIAGNOSIS — H2513 Age-related nuclear cataract, bilateral: Secondary | ICD-10-CM | POA: Diagnosis not present

## 2020-12-29 DIAGNOSIS — H40033 Anatomical narrow angle, bilateral: Secondary | ICD-10-CM | POA: Diagnosis not present

## 2020-12-29 DIAGNOSIS — H43393 Other vitreous opacities, bilateral: Secondary | ICD-10-CM | POA: Diagnosis not present

## 2020-12-29 LAB — HM DIABETES EYE EXAM

## 2021-01-14 NOTE — Progress Notes (Signed)
Chief Complaint  Patient presents with   Establish Care    Patient here to re-establish care. Patient is not fasting-ate lots of sugar this am with breakfast-Pepsi, honey and lots of grape jelly. Does not want to see Dr. Lovena Le (replacement for Altheimer). No f/u scheduled-they were only doing virtual visits, last A1c 6 mo ago. Had eye exam a few weeks ago with Dr. Katy Fitch, I will get. Is taking simvastatin and synthroid but not taking his diabetes medication. Wants to discuss depression and sleep apnea as well.     Patient presents to re-establish care.  Last visit here was in 2019. He has only been under the care of Dr. Elyse Hsu (endocrinologist), who managed his diabetes and hypothyroidism, as he had [employer].  Dr. Elyse Hsu recently retired. He previously had a lot of stressors related to caring for his mother with dementia.  She passed away in Feb 17, 2018, and that, along with adopting 2 shelter dogs in early 2020, has helped his mood significantly.  He previously was under the care of Dr. Chucky May, but didn't find the medications helpful, and she no longer took his insurance.     Diabetes: He has been controlling his diabetes with basal insulin alone.  Per Dr. Darryl Nestle last note, he is on Semglee (insulin glargine pen(SoloStar) 15-18 units daily. That is what was prescribed, but he reports he had been taking 23 U when taking it (see below). Also, that the insulin has been changing (various version of same generic). Last A1c was 6.3% in 4/22. He reports experimenting with the insulin and doesn't see a difference in his sugars whether he takes the insulin or not, so every once in a while he skips a week, other times takes it most nights.  He last took insulin 3-4 days ago.  He had a lot of sugar with his breakfast this morning, including a regular soda.  He had pepsi today only because the diet pepsi wasn't working properly--first regular soda since diagnosed with DM.  He reports that he  used to take Victoza and insulin. Sugars were in the 90's when he took both, reports sugars 120's when on insulin alone. He has been increasing this own insulin dose, trying to see low sugars in the morning (still seeing 120's). He admits to "overeating".  Sugar is averaging 124, only checks it fasting.  Once saw it 160. No hypoglycemia in a couple of years.  He denies polydipsia, polyuria, numbness or tingling. He checks his feet regularly and denies any numbness, burning, or lesions. Last eye exam was a few weeks ago with Dr. Katy Fitch.  Last screen for nephropathy that I could see was in 2019 through [facility].   Exercise:  Walks 2 miles most days with his dogs (40 minutes)   Hypothyroidism:   Pt has long history of noncompliance with oral meds. Last TSH through endo was 23.77 in 06/2020, when taking 137 mcg of levothyroxine. It had been 4.117 in 02/2020. Per Dr. Altheimer's notes, dose was increased to 135mg in 08/2020, and is due to be rechecked.  He reports compliance with his thyroid medication, no missed doses, but is taking it along with his simvastatin and vitamins.  He takes it on an empty stomach.  Denies changes to hair/skin/nails/bowels. He has chronic depression, moods are no worse than in the past-- "moods are actually better because I don't give a shit". No longer sees Dr. KToy Caredue to insurance issues.  He has very low self esteem.   Hyperlipidemia:  He has been prescribed simvastatin 70m, but has had issues with noncompliance. Today he reports he has been compliant with the medication, though diet hasn't been great. Eating a lot of BLT sandwiches (with tKu[place]bacon).  Review of labs through [facility]: 06/2020: A1c 6.3% Normal c-met (glu 95), Vit D-OH 34.1, TSH 23.77 Lipids: TC 164, LDL 103, HDL 35, TG 165, chol/HDL ratio 4.7     Past Medical History:  Diagnosis Date   Allergic rhinitis, cause unspecified    s/p immunotherapy   Depression    sees Dr. KToy Care  Diabetes ([facility] [address] 05/2015    Diverticulosis 7/09   Head injury 3/08   with facial fracture   Internal hemorrhoid 7/09   Male hypogonadism 22013/02/24  low testosterone   Unspecified hypothyroidism    Past Surgical History:  Procedure Laterality Date   CARPAL TUNNEL RELEASE Right    right   COLONOSCOPY  10/07/07   Dr. SMichail Sermon diverticulosis and small internal hemorrhoids; repeat 10 yrs   INGUINAL HERNIA REPAIR  2February 24, 2001  Dr. NHarlene RamusHERNIA REPAIR Right 03/11/2017   Procedure: HERNIA REPAIR INGUINAL ERAS PATWAY;  Surgeon: IFanny Skates MD;  Location: [facility]  Service: General;  Laterality: Right;   INSERTION OF MESH Right 03/11/2017   Procedure: INSERTION OF MESH;  Surgeon: IFanny Skates MD;  [facility]  Service: General;  Laterality: Right;   KNEE SURGERY Right 8/07   right, arthroscopic; Dr. WNoemi Chapel  ORIF FINGER FRACTURE Right    R 3rd and 4th fingers   SHOULDER SURGERY     bilateral (10/05 L; 8/07 R)   Social History   Social History Narrative   Moved in with his mother when his father got sick.  He passed away in 202/24/2013  Mother passed away 1November 24, 2019   Has 2 dogs.   Social History   Tobacco Use   Smoking status: Never   Smokeless tobacco: Never  Substance Use Topics   Alcohol use: No   Drug use: No   Outpatient Encounter Medications as of 01/15/2021  Medication Sig Note   Ascorbic Acid (VITAMIN C) 1000 MG tablet Take 1,000 mg by mouth daily.    b complex vitamins capsule Take 1 capsule by mouth daily.    glucose blood test strip Use as instructed    insulin glargine (SEMGLEE) 100 UNIT/ML injection Inject 15 Units into the skin at bedtime. 15-18 U daily 01/15/2021: Taking sporadically, none in the last few days, other times more regularly, at 23 U   Insulin Pen Needle 32G X 4 MM MISC 1 each by Does not apply route 2 (two) times daily.    Lancets (ONETOUCH ULTRASOFT) lancets Use as instructed    levothyroxine (SYNTHROID) 150 MCG tablet Take 1  tablet once a day for thyroid    Multiple Vitamins-Minerals (CENTRUM SILVER PO) Take 1 tablet by mouth daily.    Multiple Vitamins-Minerals (ZINC PO) Take 1 tablet by mouth daily.    simvastatin (ZOCOR) 10 MG tablet Take 10 mg by mouth daily.    VITAMIN D PO Take by mouth. 01/15/2021: Unknown dose (?higher end per pt)   Zn-Pyg Afri-Nettle-Saw Palmet (SAW PALMETTO COMPLEX PO) Take 1 capsule by mouth daily. 01/15/2021: Unsure of dose or type of saw palmetto   [DISCONTINUED] SYNTHROID 125 MCG tablet Take 1 tablet (125 mcg total) by mouth daily before breakfast.    ibuprofen (ADVIL,MOTRIN) 200 MG tablet Take 800 mg by mouth every 6 (  six) hours as needed. Reported on 09/20/2015 (Patient not taking: Reported on 01/15/2021) 06/14/2015: Uses prn, infrequently   liraglutide (VICTOZA) 18 MG/3ML SOPN Inject 1.8 mg into the skin daily.  (Patient not taking: Reported on 01/15/2021)    [DISCONTINUED] Insulin Glargine (BASAGLAR KWIKPEN) 100 UNIT/ML SOPN Take 10 units SQ once daily, and increase by 3 units every 3-4 days as directed (Patient not taking: Reported on 01/15/2021) 09/20/2015: Takes 20 Units daily   [DISCONTINUED] sildenafil (REVATIO) 20 MG tablet Take 1-5 tablets (20-100 mg total) by mouth daily.    [DISCONTINUED] sildenafil (VIAGRA) 100 MG tablet Take 1 tablet (100 mg total) by mouth daily as needed for erectile dysfunction.    No facility-administered encounter medications on file as of 01/15/2021.   No Known Allergies  ROS: Denies fever, chills, URI symptoms, headaches, dizziness, shortness of breath, chest pain.  Denies nausea, vomiting, bowel changes, urinary complaints, bleeding, bruising, rash. No changes to hair/skin/bowels. +chronic depression/poor self esteem/dysthymia. See HPI    PHYSICAL EXAM:   BP 130/80   Pulse 72   Ht _0  (1.753 m)   Wt 197 lb 12.8 oz (89.7 kg)   BMI 29.21 kg/m   Wt Readings from Last 3 Encounters:  01/15/21 197 lb 12.8 oz (89.7 kg)  11/20/17 191 lb 3.2 oz  (86.7 kg)  11/17/17 192 lb 6.4 oz (87.3 kg)     Pleasant male.  Somewhat anxious (especially in discussion of vaccines, paranoid about COVID vaccines, refuses).   HEENT: conjunctiva and sclera are clear, EOMI, wearing mask. Fundi benign Neck: no lymphadenopathy, thyromegaly, carotid bruit Heart: regular rate and rhythm Lungs: clear bilaterally Back: no spinal or CVA tenderness Abdomen: soft, nontender, no mass Extremities: 2+ pulses, no edema.  Normal monofilament sensation. Neuro: alert and oriented, normal strength, gait Psych: slightly anxious, depressed. Someone limited affect. Normal hygiene, grooming, speech.   Lab Results  Component Value Date   HGBA1C 5.8 (A) 01/15/2021        ASSESSMENT/PLAN:   Type 2 diabetes mellitus with hyperglycemia, with long-term current use of insulin ([place]) - educated pt re: importance of stability with insulins. Lower dose back to 15-18, take daily, monitor sugars. Discussed in detail, and risks of hypoglycemia - Plan: HgB A1c, Microalbumin / creatinine urine ratio  Mixed dyslipidemia - nonfasting today. Discussed LDL goals. Recheck at f/u, adjust statin to get to goal, if needed. Reviewed low cholesterol diet  Vitamin D deficiency  Hypothyroidism, unspecified type - dose increased to 178mg in March, and past due for recheck. Discussed proper timing of med--needs to separate from his vitamins - Plan: TSH  Episode of recurrent major depressive disorder, unspecified depression episode severity ([place] - Depression vs dysthymia.  Prev hypothyroid exacerbated. Low self esteem.  Encouraged counseling, poss also psych for meds  Need for vaccination against Streptococcus pneumoniae - risks/SE reviewed, encouraged to get due to his DM and age, agreed to take - Plan: Pneumococcal polysaccharide vaccine 23-valent greater than or equal to 2yo subcutaneous/IM  Need for influenza vaccination - risks/SE reviewed, now high dose flu shot, discussed and all  questions answered - Plan: Flu Vaccine QUAD High Dose(Fluad)  Vaccine counseling - very hesitant/paranoid regarding COVID vaccines. Encouraged, but in lieu of vaccine, disc importance of testing and antivirals within 5d of sx if +  High dose flu shot and Pneumovax given today after much counseling and all questions answered. (Prev had prevnar-13) Refused COVID vaccine, counseled/discussed.  Will need lipids when fasting, but also when thyroid is  adequately replaced. To come fasting for 6 week f/u visit. He doesn't intend to f/u with endo (unless I state that he needs to).  Will get eye exam report.   F/u 6 weeks (fasting) Also to schedule for next available Welcome to Medicare visit  I spent 67 minutes dedicated to the care of this patient, including pre-visit review of records, face to face time, post-visit ordering of testing and documentation.

## 2021-01-15 ENCOUNTER — Encounter: Payer: Self-pay | Admitting: Family Medicine

## 2021-01-15 ENCOUNTER — Ambulatory Visit (INDEPENDENT_AMBULATORY_CARE_PROVIDER_SITE_OTHER): Payer: Medicare HMO | Admitting: Family Medicine

## 2021-01-15 ENCOUNTER — Other Ambulatory Visit: Payer: Self-pay

## 2021-01-15 VITALS — BP 130/80 | HR 72 | Ht 69.0 in | Wt 197.8 lb

## 2021-01-15 DIAGNOSIS — F339 Major depressive disorder, recurrent, unspecified: Secondary | ICD-10-CM

## 2021-01-15 DIAGNOSIS — Z794 Long term (current) use of insulin: Secondary | ICD-10-CM | POA: Diagnosis not present

## 2021-01-15 DIAGNOSIS — E039 Hypothyroidism, unspecified: Secondary | ICD-10-CM

## 2021-01-15 DIAGNOSIS — E559 Vitamin D deficiency, unspecified: Secondary | ICD-10-CM | POA: Diagnosis not present

## 2021-01-15 DIAGNOSIS — E782 Mixed hyperlipidemia: Secondary | ICD-10-CM

## 2021-01-15 DIAGNOSIS — Z23 Encounter for immunization: Secondary | ICD-10-CM | POA: Diagnosis not present

## 2021-01-15 DIAGNOSIS — E1165 Type 2 diabetes mellitus with hyperglycemia: Secondary | ICD-10-CM

## 2021-01-15 DIAGNOSIS — Z7185 Encounter for immunization safety counseling: Secondary | ICD-10-CM

## 2021-01-15 LAB — POCT GLYCOSYLATED HEMOGLOBIN (HGB A1C): Hemoglobin A1C: 5.8 % — AB (ref 4.0–5.6)

## 2021-01-15 NOTE — Patient Instructions (Addendum)
Switch your vitamin to taking them at bedtime (they need to be separated by at least 4 hours from your thyroid medication). We are rechecking your thyroid today to ensure that your current dose is correct.  Now that your insurance has changed, you should try and find a psychiatrist covered by your insurance.  I also highly recommend counseling, to help work on your self esteem.  Take your insulin daily (no need to be doing experiments).  Check your sugars twice daily over the next few weeks and send [place] the list (you can send via MyChart when you sign up). Best times are before eating breakfast, and 2 hours after a meal or at bedtime. Try and not overeat. Have the pharmacy send [place] your next refill request (rather than to [facility]).  Take 15 Units of insulin at bedtime. Only increase to 18 units if your morning sugars are consistently >130 for 2-3 days in a row.  Keep it at the 18 (if you needed to increase).  Your A1c today was excellent at 5.8.  You likely do NOT need higher doses of insulin. I suspect that your overeating may be related to feeling hungry, possibly due to LOW blood sugars (because of the higher insulin level).

## 2021-01-16 LAB — MICROALBUMIN / CREATININE URINE RATIO
Creatinine, Urine: 100.8 mg/dL
Microalb/Creat Ratio: 7 mg/g creat (ref 0–29)
Microalbumin, Urine: 6.7 ug/mL

## 2021-01-16 LAB — TSH: TSH: 4.27 u[IU]/mL (ref 0.450–4.500)

## 2021-01-17 ENCOUNTER — Other Ambulatory Visit: Payer: Self-pay | Admitting: *Deleted

## 2021-01-17 MED ORDER — INSULIN GLARGINE 100 UNIT/ML ~~LOC~~ SOLN: 15.0000 [IU] | Freq: Every day | SUBCUTANEOUS | 0 refills | Status: DC

## 2021-01-26 ENCOUNTER — Encounter: Payer: Self-pay | Admitting: Family Medicine

## 2021-01-31 ENCOUNTER — Telehealth: Payer: Self-pay

## 2021-01-31 NOTE — Telephone Encounter (Signed)
P.A. Shelly Coss

## 2021-02-01 NOTE — Telephone Encounter (Signed)
Ok to switch to [employer], same directions

## 2021-02-01 NOTE — Telephone Encounter (Signed)
Pt's insurance requires trial and failure or reason pt can't use formulary alternatives which are Flextouch U-100, Levemir U-100, [place] Solostar, [place] Flextouch or Tresiba U-100, can pt be switched to one of these ?

## 2021-02-02 MED ORDER — INSULIN DETEMIR 100 UNIT/ML ~~LOC~~ SOLN: SUBCUTANEOUS | 0 refills | Status: DC

## 2021-02-02 NOTE — Telephone Encounter (Signed)
Called pt and informed

## 2021-02-02 NOTE — Telephone Encounter (Signed)
Switched to Levemir and called pt and informed, he will call back if not affordable.  He also wants to know if he can just see you for his diabetes care, he really really does not want to go back to the [facility] doctors for his diabetes treatment.  He just recently had labs there and wants you to handle his diabetes care.  Please let him know.

## 2021-02-02 NOTE — Telephone Encounter (Signed)
It generally is expected that when labs are done for a provider, that they see the provider to follow-up and discuss the results.  So, my rec would be to keep his f/u appt on 11/15 as he has scheduled, but he doesn't need to continue to follow there going forward (unless having a lot of issues with controlling either his diabetes or thyroid, in which case I will send him back).

## 2021-02-05 ENCOUNTER — Other Ambulatory Visit: Payer: Self-pay | Admitting: *Deleted

## 2021-02-05 ENCOUNTER — Telehealth: Payer: Self-pay

## 2021-02-05 MED ORDER — INSULIN DETEMIR 100 UNIT/ML FLEXPEN
15.0000 [IU] | PEN_INJECTOR | Freq: Every day | SUBCUTANEOUS | 0 refills | Status: DC
Start: 2021-02-05 — End: 2021-04-19

## 2021-02-05 MED ORDER — INSULIN PEN NEEDLE 32G X 4 MM MISC: 1.0000 | Freq: Two times a day (BID) | 0 refills | Status: DC

## 2021-02-05 NOTE — Telephone Encounter (Signed)
Pt. Called stating that his ins. Company gave him a list of insulins that were covered by his ins. At a copay cost of 35 dollars a month which is more affordable. He said the one you called in for him he was going to have to pay about 75 dollars for and that was a little too expensive for him. He gave me the list of covered medication and Levemir the one you called in was on it, so he called the pharmacy to let them knw. He said the pharmacists told him that they covered the insulin pen levemir at 35 dollars but not the vials of insulin. He wanted to know if you could change that to the insulin pen instead of the vials. He said to tell you thank you.

## 2021-02-05 NOTE — Telephone Encounter (Signed)
Please change to pen

## 2021-02-05 NOTE — Telephone Encounter (Signed)
Changed to pen and patient notified.

## 2021-02-06 DIAGNOSIS — E1165 Type 2 diabetes mellitus with hyperglycemia: Secondary | ICD-10-CM | POA: Diagnosis not present

## 2021-02-06 DIAGNOSIS — E039 Hypothyroidism, unspecified: Secondary | ICD-10-CM | POA: Diagnosis not present

## 2021-02-06 DIAGNOSIS — E785 Hyperlipidemia, unspecified: Secondary | ICD-10-CM | POA: Diagnosis not present

## 2021-02-06 DIAGNOSIS — E559 Vitamin D deficiency, unspecified: Secondary | ICD-10-CM | POA: Diagnosis not present

## 2021-02-06 DIAGNOSIS — E1169 Type 2 diabetes mellitus with other specified complication: Secondary | ICD-10-CM | POA: Diagnosis not present

## 2021-02-06 DIAGNOSIS — Z794 Long term (current) use of insulin: Secondary | ICD-10-CM | POA: Diagnosis not present

## 2021-02-25 NOTE — Progress Notes (Signed)
Chief Complaint  Patient presents with   Follow-up    6 week follow up. He is asking if he could be referred to a therapist, just someone to talk to-feels lonely, lives alone.    Patient presents for 6 week follow-up.  He had labs done and saw endocrinologist (phone call/virtual) last month. He doesn't intend to continue to see endo, just f/u here.  Hypothyroidism:  TSH was 23.77 in 06/2020, when taking 181mg of levothyroxine (had beeen 4.117 in 02/2020).  Dr. AElyse Hsuincreased dose to 1515m in 08/2020, but endo confirmed with pharmacy that he never filled the 15067mdose, and that 137m69mose was filled in May and November. He had admitted to poor adherence prior to April labs. Recheck 01/2021 was normal at 2.15 (on 137mc22mse).   At his last visit he reported taking thyroid medication on an empty stomach, but along with his simvastatin and vitamins.  He switched this--he takes his vitamins an hour or two later. He denies changes to hair/skin/nails/bowels.  Chronic mood issues (dysthymia/depression, low self esteem), no changes. No longer under the care of Dr. Kaur Toy Careto cost/coverage. Asking about counseling.  Hyperlipidemia:  LDL has remained above goal despite reported compliance with simvastatin 10mg 44mor h/o noncompliance with meds). He currently reports eating about 12 eggs/week and lots of cheese. Recent labs showed LDL 108, and TG 181, HDL 32, chol/HDL ratio 5.1 Endocrinologist notes stated  "If not improved on next check, recommend dose titration or change to high-intensity statin and/or addition of ezetimibe to target LDL goal < 70". He hasn't tried any other statins in the past.  Diabetes: Recent A1c was 6%.  At his last visit here he reported "experimenting" with his insulin. He was sometimes taking 23 U, and every once in a while skipping a week. We had discussed the risks and reasons to take regularly, at the prescribed dose of 15-18 U daily (Semglee, or whatever is being  covered--currently on Levemir).  Per endo, he is to adjust as needed for goal AM fasting 90-130, and to avoid hypoglycemia. Sugars are running 120's in the mornings. Not checking other times.  Denies hypoglycemia, but admits to skipping meals.   Vitamin D deficiency:  Patient is taking MVI daily (no separate D); last check was 34 in 06/2020.  He is asking to schedule physical (last one was in 2019).  He reports being overdue for colonoscopy.   PMH, PSH, SH reviewed  Outpatient Encounter Medications as of 02/26/2021  Medication Sig Note   Ascorbic Acid (VITAMIN C) 1000 MG tablet Take 1,000 mg by mouth daily.    b complex vitamins capsule Take 1 capsule by mouth daily.    glucose blood test strip Use as instructed    insulin detemir (LEVEMIR) 100 UNIT/ML FlexPen Inject 15-18 Units into the skin daily. (Patient taking differently: Inject 18 Units into the skin daily.)    Insulin Pen Needle 32G X 4 MM MISC 1 each by Does not apply route 2 (two) times daily.    Lancets (ONETOUCH ULTRASOFT) lancets Use as instructed    levothyroxine (SYNTHROID) 137 MCG tablet Take 137 mcg by mouth daily before breakfast.    Multiple Vitamins-Minerals (CENTRUM SILVER PO) Take 1 tablet by mouth daily.    Multiple Vitamins-Minerals (ZINC PO) Take 1 tablet by mouth daily.    simvastatin (ZOCOR) 10 MG tablet Take 10 mg by mouth daily.    VITAMIN D PO Take by mouth. 01/15/2021: Unknown dose (?higher end per pt)  Zn-Pyg Afri-Nettle-Saw Palmet (SAW PALMETTO COMPLEX PO) Take 1 capsule by mouth daily. 01/15/2021: Unsure of dose or type of saw palmetto   [DISCONTINUED] levothyroxine (SYNTHROID) 150 MCG tablet Take 1 tablet once a day for thyroid    ibuprofen (ADVIL,MOTRIN) 200 MG tablet Take 800 mg by mouth every 6 (six) hours as needed. Reported on 09/20/2015 (Patient not taking: Reported on 01/15/2021) 06/14/2015: Uses prn, infrequently   liraglutide (VICTOZA) 18 MG/3ML SOPN Inject 1.8 mg into the skin daily.  (Patient not  taking: Reported on 01/15/2021)    No facility-administered encounter medications on file as of 02/26/2021.   ROS: Denies fever, chills, URI symptoms, headaches, dizziness, shortness of breath, chest pain.  Denies nausea, vomiting, bowel changes, bleeding, bruising, rash. See HPI   PHYSICAL EXAM:  BP 130/86   Pulse 84   Ht '5\' 9"'  (1.753 m)   Wt 197 lb 12.8 oz (89.7 kg)   BMI 29.21 kg/m    Wt Readings from Last 3 Encounters:  02/26/21 197 lb 12.8 oz (89.7 kg)  01/15/21 197 lb 12.8 oz (89.7 kg)  11/20/17 191 lb 3.2 oz (86.7 kg)   Well-appearing male, in no distress HEENT: conjunctiva and sclera are clear, EOMI, wearing mask. Fundi benign Neck: no lymphadenopathy, thyromegaly, carotid bruit Heart: regular rate and rhythm Lungs: clear bilaterally Back: no spinal or CVA tenderness Abdomen: soft, nontender, no mass Extremities: 2+ pulses, no edema.   Neuro: alert and oriented, normal strength, gait Psych: somewhat irritable/excitable--in discussing pharmacist and cost of insulins (that he couldn't calculate the cost properly referred to "[employer] [place]" in discussing need to contains costs; got very irritable/upset when mentioned "[place]" as a place to refer for counseling). Normal hygiene, grooming, speech.  Labs from [facility] 01/28/21:  TSH 2.15 A1c 6.0% Fasting glu 86, normal c-met LDL Direct <100 mg/dL 108 High    Comment: NCEP-ATP III Guidelines    LDLc                     Risk Classification                         <100 mg/dL                   Optimal    100-129 mg/dL                Desirable    130-159 mg/dL                Borderline High    160-189 mg/dL                High    >190 mg/dL                   Very High  Total Cholesterol <200 MG/DL 164   Comment: NCEP III Guidelines   Total Cholesterol             Risk Classification                         <200 mg/dL                      Desirable    200-239 mg/dL                   Borderline High    >240 mg/dL  High  Triglycerides <150 MG/DL 181 High    Comment: NCEP-ATP III Guidelines    Triglyceride Value           Risk Classification                         <150 mg/dL                   Normal    150-199 mg/dL                Borderline High    200-499 mg/dL                High    >=500 mg/dL                  Very High  HDL Cholesterol >=60 MG/DL 32 Low    Comment: NCEP III Guidelines    HDLc                     Risk Classification                         >=60 mg/dL                  Optimal    >=40 mg/dL                  Desirable    <40 mg/dL                   Low  Total Chol / HDL Cholesterol <4.5 5.1 High    Non-HDL Cholesterol MG/DL 132     ASSESSMENT/PLAN:   Type 2 diabetes mellitus with hyperglycemia, with long-term current use of insulin (HCC) - DM has been controlled; skipping meals and risk for hypoglycemia. Disc importance of checking glu more freq, not skipping meals  Mixed dyslipidemia - to cut back on cheese and egg yolks.  Given low HDL, elev chol/HDL ratio and LDL above goal, will switch to Crestor 78m daily. Recheck 2 mos - Plan: rosuvastatin (CRESTOR) 10 MG tablet, Lipid panel  Hypothyroidism, unspecified type - adequately replaced with 137 mcg dose  Vitamin D deficiency - cont supplements  Colon cancer screening - Plan: Ambulatory referral to Gastroenterology  Medication monitoring encounter - Plan: Lipid panel, Hepatic function panel   I don't see the need to wait until next visit to increase his simvastatin (vs change to a different statin) given LDL>100 (more than once), elevated TG and high ratio. He likely can get LDL down with dietary changes, but is willing to try switching statin for better overall effect with stronger statin. Risks/SE reviewed.  To stop simva and replace with crestor. Advised if he has SE to take CoQ10, and if not helpful, to contact [place]. Or, we could go back to simva, and increase to 282mdaily (he  recently filled 90d supply of 1081m  F/u 5 mos for med check. Will try and find next available CPE.

## 2021-02-26 ENCOUNTER — Other Ambulatory Visit: Payer: Self-pay

## 2021-02-26 ENCOUNTER — Telehealth: Payer: Self-pay

## 2021-02-26 ENCOUNTER — Ambulatory Visit (INDEPENDENT_AMBULATORY_CARE_PROVIDER_SITE_OTHER): Payer: Medicare HMO | Admitting: Family Medicine

## 2021-02-26 ENCOUNTER — Encounter: Payer: Self-pay | Admitting: Family Medicine

## 2021-02-26 VITALS — BP 130/86 | HR 84 | Ht 69.0 in | Wt 197.8 lb

## 2021-02-26 DIAGNOSIS — Z5181 Encounter for therapeutic drug level monitoring: Secondary | ICD-10-CM

## 2021-02-26 DIAGNOSIS — E559 Vitamin D deficiency, unspecified: Secondary | ICD-10-CM | POA: Diagnosis not present

## 2021-02-26 DIAGNOSIS — Z1211 Encounter for screening for malignant neoplasm of colon: Secondary | ICD-10-CM

## 2021-02-26 DIAGNOSIS — E1165 Type 2 diabetes mellitus with hyperglycemia: Secondary | ICD-10-CM

## 2021-02-26 DIAGNOSIS — Z794 Long term (current) use of insulin: Secondary | ICD-10-CM | POA: Diagnosis not present

## 2021-02-26 DIAGNOSIS — E039 Hypothyroidism, unspecified: Secondary | ICD-10-CM

## 2021-02-26 DIAGNOSIS — E782 Mixed hyperlipidemia: Secondary | ICD-10-CM

## 2021-02-26 MED ORDER — ROSUVASTATIN CALCIUM 10 MG PO TABS: 10.0000 mg | ORAL_TABLET | Freq: Every day | ORAL | 2 refills | Status: DC

## 2021-02-26 NOTE — Telephone Encounter (Signed)
Pt. Called back to let you know that he has been taking the 137 mcg of levothyroxine not 150 mcg.

## 2021-02-26 NOTE — Patient Instructions (Addendum)
I believe that you are taking 137 mcg of your thyroid medication.  Please let us know if this is wrong.  If it is correct, no need to contact us.  Do let us know if you are taking the BRAND of synthroid or levoxyl.  Otherwise I will assume it is the generic levothyroxine.  We are switching your simvastatin to rosuvastatin (generic for crestor).  Take it once daily, just as you did the simvastatin.  If you develop any muscle aches or side effects, you can start taking CoEnzyme Q10 once daily, which will help prevent them.  If you still do not tolerate the new medications, please contact us and we can tell you how to change how you are taking it (there are a variety of different ways that you may be able to take the crestor and tolerate it better, but let's see if you have a problem with taking it daily first).  Other option would be to stop the crestor if you can't tolerate it, and go back to simvastatin, but taking 2 tablets together (20mg  dose).  So don't throw away the new bottle of simvastatin.  Don't take it, but keep it aside just in case.  Daily exercise will help raise the HDL, in addition to the new cholesterol possibly helping the HDL.  Please check your sugar at least twice daily. Please do not skip meals. They don't have to be large meals, but going for long periods without any food can increase your risk of sugars dropping dangerously low. You always want to have juice or hard candies (with sugar) available in case you feel weak and sugars are low.  Reach out to [facility] or another therapist that takes your insurance (you may want to check with [facility]).

## 2021-04-02 ENCOUNTER — Other Ambulatory Visit: Payer: Self-pay | Admitting: Family Medicine

## 2021-04-02 DIAGNOSIS — E782 Mixed hyperlipidemia: Secondary | ICD-10-CM

## 2021-04-03 ENCOUNTER — Ambulatory Visit: Payer: Medicare HMO | Admitting: Family Medicine
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
## 2021-04-08 NOTE — Patient Instructions (Addendum)
HEALTH MAINTENANCE RECOMMENDATIONS:  It is recommended that you get at least 30 minutes of aerobic exercise at least 5 days/week (for weight loss, you may need as much as 60-90 minutes). This can be any activity that gets your heart rate up. This can be divided in 10-15 minute intervals if needed, but try and build up your endurance at least once a week.  Weight bearing exercise is also recommended twice weekly.  Eat a healthy diet with lots of vegetables, fruits and fiber.  "Colorful" foods have a lot of vitamins (ie green vegetables, tomatoes, red peppers, etc).  Limit sweet tea, regular sodas and alcoholic beverages, all of which has a lot of calories and sugar.  Up to 2 alcoholic drinks daily may be beneficial for men (unless trying to lose weight, watch sugars).  Drink a lot of water.  Sunscreen of at least SPF 30 should be used on all sun-exposed parts of the skin when outside between the hours of 10 am and 4 pm (not just when at beach or pool, but even with exercise, golf, tennis, and yard work!)  Use a sunscreen that says "broad spectrum" so it covers both UVA and UVB rays, and make sure to reapply every 1-2 hours.  Remember to change the batteries in your smoke detectors when changing your clock times in the spring and fall.  Carbon monoxide detectors are recommended for your home.  Use your seat belt every time you are in a car, and please drive safely and not be distracted with cell phones and texting while driving.    Johnathan Estrada , Thank you for taking time to come for your Welcome to Medicare Visit. I appreciate your ongoing commitment to your health goals. Please review the following plan we discussed and let me know if I can assist you in the future.   This is a list of the screening recommended for you and due dates:  Health Maintenance  Topic Date Due   COVID-19 Vaccine (1) Never done   Complete foot exam   Never done   Colon Cancer Screening  Never done   Zoster (Shingles)  Vaccine (1 of 2) Never done   Hemoglobin A1C  07/16/2021   Eye exam for diabetics  12/29/2021   Urine Protein Check  01/15/2022   Tetanus Vaccine  10/24/2025   Pneumonia Vaccine  Completed   Flu Shot  Completed   Hepatitis C Screening: USPSTF Recommendation to screen - Ages 18-79 yo.  Completed   HIV Screening  Completed   HPV Vaccine  Aged Out   Your foot exam was performed today. I encourage COVID vaccines.  I recommend getting the new shingles vaccine (Shingrix). Since you have Medicare, you will need to get this from the pharmacy, as it is covered by Part D. This is a series of 2 injections, spaced 2 months apart.   This should be separated from other vaccines by at least 2 weeks.  Follow-up as scheduled for fasting labs later this month, and for your next med check in June.  Please let us know the dose of your Vitamin D  Please get [place] copies of your living will and healthcare power of attorney once completed/notarized so that they can be scanned into your medical chart.  We put in a referral for Cologuard for colon cancer screening.  We are doing this in place of colonoscopy given that it is hard to have someone bring you for that procedure. If the Cologuard is negative,  we do this test every 3 years.  If it is abnormal, we will have to figure something out, as you will need to have a colonoscopy if the Cologuard is abnormal.  You can reach out to [place] [facility] (part of [facility]) to ask about which therapists are taking new patients.  Versus you checking with your insurance for suggestions.

## 2021-04-09 ENCOUNTER — Other Ambulatory Visit: Payer: Self-pay | Admitting: Family Medicine

## 2021-04-09 ENCOUNTER — Encounter: Payer: Self-pay | Admitting: Family Medicine

## 2021-04-09 ENCOUNTER — Other Ambulatory Visit: Payer: Self-pay

## 2021-04-09 ENCOUNTER — Ambulatory Visit (INDEPENDENT_AMBULATORY_CARE_PROVIDER_SITE_OTHER): Payer: Medicare HMO | Admitting: Family Medicine

## 2021-04-09 VITALS — BP 122/74 | HR 80 | Ht 70.0 in | Wt 204.6 lb

## 2021-04-09 DIAGNOSIS — E039 Hypothyroidism, unspecified: Secondary | ICD-10-CM

## 2021-04-09 DIAGNOSIS — R972 Elevated prostate specific antigen [PSA]: Secondary | ICD-10-CM | POA: Diagnosis not present

## 2021-04-09 DIAGNOSIS — Z794 Long term (current) use of insulin: Secondary | ICD-10-CM

## 2021-04-09 DIAGNOSIS — E1165 Type 2 diabetes mellitus with hyperglycemia: Secondary | ICD-10-CM

## 2021-04-09 DIAGNOSIS — Z1211 Encounter for screening for malignant neoplasm of colon: Secondary | ICD-10-CM

## 2021-04-09 DIAGNOSIS — Z6829 Body mass index (BMI) 29.0-29.9, adult: Secondary | ICD-10-CM

## 2021-04-09 DIAGNOSIS — F341 Dysthymic disorder: Secondary | ICD-10-CM

## 2021-04-09 DIAGNOSIS — E782 Mixed hyperlipidemia: Secondary | ICD-10-CM | POA: Diagnosis not present

## 2021-04-09 DIAGNOSIS — Z0000 Encounter for general adult medical examination without abnormal findings: Secondary | ICD-10-CM

## 2021-04-09 DIAGNOSIS — E663 Overweight: Secondary | ICD-10-CM

## 2021-04-09 DIAGNOSIS — Z125 Encounter for screening for malignant neoplasm of prostate: Secondary | ICD-10-CM | POA: Diagnosis not present

## 2021-04-10 LAB — PSA: Prostate Specific Ag, Serum: 4.5 ng/mL — ABNORMAL HIGH (ref 0.0–4.0)

## 2021-04-10 NOTE — Progress Notes (Signed)
Please check with Johnathan Estrada if free PSA can be added.  If so, dx is elevated PSA.  If it can't, advise pt that prostate screening test was only mildly elevated (but much higher than last check in 2015). I'd like to do an extra test which will help [place] determine the likelihood of this elevation being benign or not. Ultimately he may need to be referred to urologist, if the additional test is concerning (vs closer monitoring if reassuring).

## 2021-04-12 LAB — FPSA% REFLEX
% FREE PSA: 19.1 %
PSA, FREE: 0.86 ng/mL

## 2021-04-12 LAB — PSA TOTAL (REFLEX TO FREE): Prostate Specific Ag, Serum: 4.5 ng/mL — ABNORMAL HIGH (ref 0.0–4.0)

## 2021-04-12 LAB — SPECIMEN STATUS REPORT

## 2021-04-15 ENCOUNTER — Ambulatory Visit (HOSPITAL_COMMUNITY)
Admission: EM | Admit: 2021-04-15 | Discharge: 2021-04-15 | Disposition: A | Discharge status: Home / Self Care | Payer: Medicare HMO | Attending: Nurse Practitioner | Admitting: Nurse Practitioner

## 2021-04-15 ENCOUNTER — Other Ambulatory Visit: Payer: Self-pay

## 2021-04-15 ENCOUNTER — Encounter (HOSPITAL_COMMUNITY): Payer: Self-pay | Admitting: *Deleted

## 2021-04-15 DIAGNOSIS — R220 Localized swelling, mass and lump, head: Secondary | ICD-10-CM

## 2021-04-15 DIAGNOSIS — K1120 Sialoadenitis, unspecified: Secondary | ICD-10-CM | POA: Diagnosis not present

## 2021-04-15 MED ORDER — DEXAMETHASONE SODIUM PHOSPHATE 10 MG/ML IJ SOLN
10.0000 mg | Freq: Once | INTRAMUSCULAR | Status: AC
  Administered 2021-04-15: 10 mg via INTRAMUSCULAR

## 2021-04-15 MED ORDER — AMOXICILLIN-POT CLAVULANATE 875-125 MG PO TABS
1.0000 | ORAL_TABLET | Freq: Two times a day (BID) | ORAL | 0 refills | Status: DC
Start: 2021-04-15 — End: 2021-09-19

## 2021-04-15 MED ORDER — DEXAMETHASONE SODIUM PHOSPHATE 10 MG/ML IJ SOLN
INTRAMUSCULAR | Status: AC
  Filled 2021-04-15: qty 1

## 2021-04-15 NOTE — ED Provider Notes (Addendum)
[facility]    CSN: [id number] Arrival date & time: 04/15/21  1055      History   Chief Complaint Chief Complaint  Patient presents with   Dental Pain   Tongue Lesion    HPI Johnathan Estrada is a 66 y.o. male.   History of Present Illness  Johnathan Estrada is a 66 y.o. male that presents with swelling and pain to the area underneath the tongue. Onset of symptoms was abrupt starting 1 day ago. Patient describes pain as aching. Pain severity now is 5 /10. The pain does not radiate. Patient denies drainage, gum swelling, tooth pain, jaw swelling or fever. Pain is aggravated by palpation. Pain is alleviated by nothing. The patient denies other complaints. He has never smoked, dipped or chewed tobacco. No vaping. Patient has not sought treatment by another care provider for this problem. Care prior to arrival consisted of nothing with no relief.     Past Medical History:  Diagnosis Date   Allergic rhinitis, cause unspecified    s/p immunotherapy   Depression    sees Dr. Evelene Croon   Diabetes [facility]) 05/2015   Diverticulosis 7/09   Head injury 3/08   with facial fracture   Internal hemorrhoid 7/09   Male hypogonadism 2013   low testosterone   Unspecified hypothyroidism     Patient Active Problem List   Diagnosis Date Noted   Mixed dyslipidemia 01/13/2017   Type 2 diabetes mellitus with hyperglycemia, with long-term current use of insulin (HCC) 01/13/2017   Right inguinal hernia 01/13/2014   Noncompliance with medication regimen 07/14/2012   Hypogonadism male 06/07/2011   Vitamin D deficiency 06/07/2011   Decreased libido 06/06/2011   Fatigue 09/19/2010   Hypothyroidism 09/19/2010   Depression 09/19/2010    Past Surgical History:  Procedure Laterality Date   CARPAL TUNNEL RELEASE Right    right   COLONOSCOPY  10/07/2007   Dr. Bosie Clos; diverticulosis and small internal hemorrhoids; repeat 10 yrs   HERNIA REPAIR     INGUINAL HERNIA REPAIR  03/26/1999   Dr. Fatima Blank HERNIA REPAIR Right 03/11/2017   Procedure: HERNIA REPAIR INGUINAL ERAS PATWAY;  Surgeon: Claud Kelp, MD;  Location: [facility];  Service: General;  Laterality: Right;   INSERTION OF MESH Right 03/11/2017   Procedure: INSERTION OF MESH;  Surgeon: Claud Kelp, MD;  Location: [facility];  Service: General;  Laterality: Right;   KNEE SURGERY Right 10/23/2005   right, arthroscopic; Dr. Thurston Hole   ORIF FINGER FRACTURE Right    R 3rd and 4th fingers   SHOULDER SURGERY     bilateral (10/05 L; 8/07 R)       Home Medications    Prior to Admission medications   Medication Sig Start Date End Date Taking? Authorizing Provider  amoxicillin-clavulanate (AUGMENTIN) 875-125 MG tablet Take 1 tablet by mouth every 12 (twelve) hours. 04/15/21  Yes Lurline Idol, FNP  Ascorbic Acid (VITAMIN C) 1000 MG tablet Take 1,000 mg by mouth daily.   Yes [provider]  b complex vitamins capsule Take 1 capsule by mouth daily.   Yes [provider]  ibuprofen (ADVIL,MOTRIN) 200 MG tablet Take 400 mg by mouth every 6 (six) hours as needed. Reported on 09/20/2015   Yes [provider]  insulin detemir (LEVEMIR) 100 UNIT/ML FlexPen Inject 15-18 Units into the skin daily. Patient taking differently: Inject 18 Units into the skin daily. 02/05/21  Yes Joselyn Arrow, MD  levothyroxine (SYNTHROID) 137 MCG tablet Take 137 mcg by mouth daily before breakfast.   Yes [provider]  Multiple Vitamins-Minerals (CENTRUM SILVER PO) Take 1 tablet by mouth daily.   Yes [provider]  Multiple Vitamins-Minerals (ZINC PO) Take 1 tablet by mouth daily.   Yes [provider]  rosuvastatin (CRESTOR) 10 MG tablet TAKE 1 TABLET(10 MG) BY MOUTH DAILY 04/02/21  Yes Joselyn ArrowKnapp, Eve, MD  VITAMIN D PO Take by mouth.   Yes [provider]  Zn-Pyg Afri-Nettle-Saw Palmet (SAW PALMETTO COMPLEX PO) Take 1 capsule by mouth daily.   Yes [provider]  glucose blood test strip Use as instructed 06/14/15   Joselyn ArrowKnapp, Eve, MD  Insulin Pen Needle 32G X 4 MM MISC 1 each by Does not apply route 2 (two) times daily. 02/05/21   Joselyn ArrowKnapp, Eve, MD  Lancets Letta Pate(ONETOUCH ULTRASOFT) lancets Use as instructed 06/14/15   Joselyn ArrowKnapp, Eve, MD    Family History Family History  Problem Relation Age of Onset   Diabetes Mother        borderline   Heart disease Mother        irregular heartbeat   COPD Mother    Dementia Mother    Diabetes Brother        pt denies this at 03/2021 visit   Stroke Maternal Grandmother    Kidney disease Maternal Grandfather    Heart disease Maternal Aunt    Cancer Maternal Uncle        colon cancer   Heart disease Maternal Uncle     Social History Social History   Tobacco Use   Smoking status: Never   Smokeless tobacco: Never  Vaping Use   Vaping Use: Never used  Substance Use Topics   Alcohol use: No   Drug use: Not Currently    Types: Marijuana    Comment: no marijuana since 1986     Allergies   Patient has no known allergies.   Review of Systems Review of Systems  Constitutional:  Negative for fever.  HENT:  Negative for dental problem, sore throat and trouble swallowing.   Neurological:  Negative for headaches.  All other systems reviewed and are negative.   Physical Exam Triage Vital Signs ED Triage Vitals  Enc Vitals Group     BP 04/15/21 1104 (!) 148/97     Pulse Rate 04/15/21 1104 71     Resp 04/15/21 1104 16     Temp 04/15/21 1104 98.1 F (36.7 C)     Temp Source 04/15/21 1104 Oral     SpO2 04/15/21 1104 99 %     Weight --      Height --      Head Circumference --      Peak Flow --      Pain Score 04/15/21 1105 5     Pain Loc --      Pain Edu? --      Excl. in GC? --    No data found.  Updated Vital Signs BP 129/89    Pulse 71    Temp 98.1 F (36.7 C) (Oral)    Resp 16    SpO2 99%   Visual Acuity Right Eye Distance:   Left Eye Distance:   Bilateral Distance:    Right  Eye Near:   Left Eye Near:    Bilateral Near:     Physical Exam Vitals reviewed.  Constitutional:      General: He is not in  acute distress.    Appearance: Normal appearance. He is normal weight. He is not ill-appearing or toxic-appearing.  HENT:     Head: Normocephalic.     Mouth/Throat:     Lips: Pink.     Mouth: Mucous membranes are moist.     Dentition: Normal dentition. No dental tenderness, gingival swelling, dental caries, dental abscesses or gum lesions.     Tongue: No lesions.     Palate: No mass and lesions.     Pharynx: Oropharynx is clear. Uvula midline. No pharyngeal swelling, oropharyngeal exudate, posterior oropharyngeal erythema or uvula swelling.     Tonsils: No tonsillar exudate or tonsillar abscesses.     Comments: Bilateral sublingual swelling noted. No salivary stone noted. No drainage. Mild tenderness with palpation noted.  Cardiovascular:     Rate and Rhythm: Normal rate.  Pulmonary:     Effort: Pulmonary effort is normal.  Musculoskeletal:        General: Normal range of motion.     Cervical back: Normal range of motion and neck supple.  Skin:    General: Skin is warm and dry.  Neurological:     General: No focal deficit present.     Mental Status: He is alert and oriented to person, place, and time.     UC Treatments / Results  Labs (all labs ordered are listed, but only abnormal results are displayed) Labs Reviewed - No data to display  EKG   Radiology No results found.  Procedures Procedures (including critical care time)  Medications Ordered in UC Medications  dexamethasone (DECADRON) injection 10 mg (has no administration in time range)    Initial Impression / Assessment and Plan / UC Course  I have reviewed the triage vital signs and the nursing notes.  Pertinent labs & imaging results that were available during my care of the patient were reviewed by me and considered in my medical decision making (see chart for details).     66 yo male presents with pain and swelling underneath the tongue that started 1 day ago.  No drainage, gum swelling, tooth pain, jaw swelling or fever.  It is uncomfortable to touch.  No problems swallowing or shortness of breath.  No sore throat.  He has never used any tobacco products.  Patient is afebrile.  Nontoxic.  Bilateral sublingual gland swelling noted. Could possibly be due to sialadenitis.  No specific stones noted on exam.  Will treat with a 1 week course of Augmentin for coverage of possible bacterial etiology. Decadron injection given in clinic ito cover viral etiology. Supportive care measures such as sucking on hard candy, gentle massage of the gland, oral hygiene and adequate hydration was discussed as well.   Today's evaluation has revealed no signs of a dangerous process. Discussed diagnosis with patient and/or guardian. Patient and/or guardian aware of their diagnosis, possible red flag symptoms to watch out for and need for close follow up. Patient and/or guardian understands verbal and written discharge instructions. Patient and/or guardian comfortable with plan and disposition.  Patient and/or guardian has a clear mental status at this time, good insight into illness (after discussion and teaching) and has clear judgment to make decisions regarding their care  This care was provided during an unprecedented National Emergency due to the Novel Coronavirus (COVID-19) pandemic. COVID-19 infections and transmission risks place heavy strains on healthcare resources.  As this pandemic evolves, our facility, providers, and staff strive to respond fluidly, to remain operational, and to provide care  relative to available resources and information. Outcomes are unpredictable and treatments are without well-defined guidelines. Further, the impact of COVID-19 on all aspects of urgent care, including the impact to patients seeking care for reasons other than COVID-19, is unavoidable during this  national emergency. At this time of the global pandemic, management of patients has significantly changed, even for non-COVID positive patients given high local and regional COVID volumes at this time requiring high healthcare system and resource utilization. The standard of care for management of both COVID suspected and non-COVID suspected patients continues to change rapidly at the local, regional, national, and global levels. This patient was worked up and treated to the best available but ever changing evidence and resources available at this current time.   Documentation was completed with the aid of voice recognition software. Transcription may contain typographical errors.  Final Clinical Impressions(s) / UC Diagnoses   Final diagnoses:  Sialadenitis  Sublingual gland swelling     Discharge Instructions      Take medications as prescribed  Suck on a lemon candy to stimulate the flow of saliva. Gently massage the gland under the tongue. Warm salt water rinses at least 3-4 times a day  Practice good oral hygiene by brushing and flossing your teeth after meals and before you go to bed. Drink enough fluid to keep your urine pale yellow.  Contact a health care provider if: You have pain and swelling in your face, jaw, or mouth after eating. You have persistent swelling in any of these places: In front of your ear. Under your jaw. Inside your mouth. Get help right away if: You have pain and swelling in your face, jaw, or mouth, and this is getting worse. Your pain and swelling make it hard to swallow or breathe.      ED Prescriptions     Medication Sig Dispense Auth. Provider   amoxicillin-clavulanate (AUGMENTIN) 875-125 MG tablet Take 1 tablet by mouth every 12 (twelve) hours. 14 tablet Lurline IdolMurrill, Lakeyshia Tuckerman, FNP      PDMP not reviewed this encounter.   Lurline IdolMurrill, Lindley Hiney, FNP 04/15/21 1546    Lurline IdolMurrill, Tawanda Schall, [place]NP 04/15/21 [phone]

## 2021-04-15 NOTE — ED Triage Notes (Signed)
Pt reports hx of dental injury approx 15 yrs ago; states started yesterday with sensitivity in that lower front tooth; this AM noticed a small lesion under left tongue.

## 2021-04-15 NOTE — Discharge Instructions (Addendum)
Take medications as prescribed  Suck on a lemon candy to stimulate the flow of saliva. Gently massage the gland under the tongue. Warm salt water rinses at least 3-4 times a day  Practice good oral hygiene by brushing and flossing your teeth after meals and before you go to bed. Drink enough fluid to keep your urine pale yellow. Change toothbrush after completing antibiotics   Contact a health care provider if: You have pain and swelling in your face, jaw, or mouth after eating. You have persistent swelling in any of these places: In front of your ear. Under your jaw. Inside your mouth. Get help right away if: You have pain and swelling in your face, jaw, or mouth, and this is getting worse. Your pain and swelling make it hard to swallow or breathe.

## 2021-04-16 ENCOUNTER — Other Ambulatory Visit: Payer: Self-pay | Admitting: *Deleted

## 2021-04-16 DIAGNOSIS — Z1211 Encounter for screening for malignant neoplasm of colon: Secondary | ICD-10-CM

## 2021-04-19 ENCOUNTER — Telehealth: Payer: Self-pay

## 2021-04-19 MED ORDER — INSULIN DETEMIR 100 UNIT/ML FLEXPEN: 15.0000 [IU] | PEN_INJECTOR | Freq: Every day | SUBCUTANEOUS | 0 refills | Status: DC

## 2021-04-19 NOTE — Telephone Encounter (Signed)
Done

## 2021-04-19 NOTE — Telephone Encounter (Signed)
Pt needs refill Levemir but needs sent to [facility] on [address], the [facility] doesn't have any in stock

## 2021-04-23 ENCOUNTER — Telehealth: Payer: Self-pay

## 2021-04-23 ENCOUNTER — Other Ambulatory Visit: Payer: Self-pay | Admitting: *Deleted

## 2021-04-23 ENCOUNTER — Other Ambulatory Visit: Payer: Medicare HMO

## 2021-04-23 ENCOUNTER — Other Ambulatory Visit: Payer: Self-pay

## 2021-04-23 DIAGNOSIS — E782 Mixed hyperlipidemia: Secondary | ICD-10-CM

## 2021-04-23 DIAGNOSIS — Z5181 Encounter for therapeutic drug level monitoring: Secondary | ICD-10-CM | POA: Diagnosis not present

## 2021-04-23 MED ORDER — INSULIN DETEMIR 100 UNIT/ML FLEXPEN: 18.0000 [IU] | Freq: Every day | SUBCUTANEOUS | 0 refills | Status: DC

## 2021-04-23 MED ORDER — INSULIN DETEMIR 100 UNIT/ML FLEXPEN: 18.0000 [IU] | PEN_INJECTOR | Freq: Every day | SUBCUTANEOUS | 0 refills | Status: DC

## 2021-04-23 NOTE — Telephone Encounter (Signed)
Ok to change directions to 18-21 U per MD's instructions. Ensure that he is priming it correctly and not wasting insulin.  thanks

## 2021-04-23 NOTE — Telephone Encounter (Signed)
Done and patient notified.

## 2021-04-23 NOTE — Telephone Encounter (Signed)
Patient called stating that pharmacy is trying to charge more to get lantus pen. States he does waste some insulin when priming new needle each time he gives shot. States every time he fills insulin he has to argue with pharmacist. He states he only uses 18 units but asks if can be wrote for 18-21 units so he has enough to last and not be overcharged?  Patient had asked to be sent to [facility] but I asked if there was anything I could help with and relayed above information

## 2021-04-24 LAB — HEPATIC FUNCTION PANEL
ALT: 16 IU/L (ref 0–44)
AST: 18 IU/L (ref 0–40)
Albumin: 4.6 g/dL (ref 3.8–4.8)
Alkaline Phosphatase: 68 IU/L (ref 44–121)
Bilirubin Total: 0.5 mg/dL (ref 0.0–1.2)
Bilirubin, Direct: 0.12 mg/dL (ref 0.00–0.40)
Total Protein: 7.3 g/dL (ref 6.0–8.5)

## 2021-04-24 LAB — LIPID PANEL
Chol/HDL Ratio: 4.6 ratio (ref 0.0–5.0)
Cholesterol, Total: 137 mg/dL (ref 100–199)
HDL: 30 mg/dL — ABNORMAL LOW (ref >39)
LDL Chol Calc (NIH): 77 mg/dL (ref 0–99)
Triglycerides: 176 mg/dL — ABNORMAL HIGH (ref 0–149)
VLDL Cholesterol Cal: 30 mg/dL (ref 5–40)

## 2021-07-03 ENCOUNTER — Telehealth: Payer: Self-pay | Admitting: Family Medicine

## 2021-07-03 ENCOUNTER — Other Ambulatory Visit: Payer: Self-pay

## 2021-07-03 MED ORDER — GLUCOSE BLOOD VI STRP: ORAL_STRIP | 2 refills | Status: DC

## 2021-07-03 NOTE — Telephone Encounter (Signed)
Pt was that is was sent in. Kh ?

## 2021-07-03 NOTE — Telephone Encounter (Signed)
Pt called and left message requesting test strips for his Accu Check Meter. [address].  ?

## 2021-07-14 ENCOUNTER — Other Ambulatory Visit: Payer: Self-pay | Admitting: Family Medicine

## 2021-07-14 DIAGNOSIS — E782 Mixed hyperlipidemia: Secondary | ICD-10-CM

## 2021-07-23 ENCOUNTER — Other Ambulatory Visit: Payer: Self-pay | Admitting: Family Medicine

## 2021-08-08 DIAGNOSIS — K5730 Diverticulosis of large intestine without perforation or abscess without bleeding: Secondary | ICD-10-CM | POA: Diagnosis not present

## 2021-08-08 DIAGNOSIS — Z1211 Encounter for screening for malignant neoplasm of colon: Secondary | ICD-10-CM | POA: Diagnosis not present

## 2021-08-08 DIAGNOSIS — K648 Other hemorrhoids: Secondary | ICD-10-CM | POA: Diagnosis not present

## 2021-08-08 LAB — HM COLONOSCOPY

## 2021-08-09 ENCOUNTER — Encounter: Payer: Self-pay | Admitting: *Deleted

## 2021-08-15 ENCOUNTER — Encounter: Payer: Self-pay | Admitting: Family Medicine

## 2021-09-17 ENCOUNTER — Telehealth: Payer: Self-pay | Admitting: *Deleted

## 2021-09-17 NOTE — Telephone Encounter (Signed)
Please advise --he doesn't need to fast as we are not checking his lipids.  We will be checking a chem panel that will have a sugar on it, can fast if he wants, but doesn't need to (we are checking A1c at the visit).  (We will be checking c-met, cbc, TSH, PSA with reflex to free, and A1c.)

## 2021-09-17 NOTE — Telephone Encounter (Signed)
Patient is scheduled for med check Wed, does he need ro fast?

## 2021-09-18 NOTE — Progress Notes (Signed)
Chief Complaint  Patient presents with   Diabetes    Nonfasting med check. Does not need any refills today. Has not gotten shingles vaccine yet. Thinks he had diabetic eye exam at [facility] in the last year-I can check. Patient thinks he may need to see a therapist-very stressed.    Patient presents for follow-up on chronic problems.  Hyperlipidemia:  simvastatin was changed to 65m rosuvastatin in 02/2021.  He denies side effects. When he separated the meds from his thyroid (as requested at last visit, when taking them all at the same time, he reports that he would forget all the other pills.  He was taking them very sporadically.  He went back to taking them all at once within the week. LDL improved (dropped from 108 to 77), and TG remained mildly elevated. He has lost a lot of weight, cut back on how much he was eating.  Lab Results  Component Value Date   CHOL 137 04/23/2021   HDL 30 (L) 04/23/2021   LDLCALC 77 04/23/2021   TRIG 176 (H) 04/23/2021   CHOLHDL 4.6 04/23/2021   Lab Results  Component Value Date   ALT 16 04/23/2021   AST 18 04/23/2021   ALKPHOS 68 04/23/2021   BILITOT 0.5 04/23/2021   DM--Last A1c was 5.8% in 12/2020. He reports that sugars were running 125 prior to weight loss.  Now 96-104 since weight loss. He remains on 18 U of levemir. "I quit eating so much". He denies polydipsia, polyuria or hypoglycemia Normal microalb/Cr ratio in 12/2020. He checks his feet regularly, denies concerns.  Hypothyroidism:  He reports some noncompliance with his medications since trying to separate them from the thyroid.  He continues on 1374m dose.   No changes to hair/skin/nails/energy. Moods aren't changed (chronic depression/dysthymia and anxiety) +intentional weight loss. Lab Results  Component Value Date   TSH 4.270 01/15/2021   Elevated PSA:  PSA was noted to be elevated at 4.5 at his physical.  Free PSA was 19.1%.  He was advised that there's about a 20%  probability of cancer, (aka 80% benign), and recommendation was for 6 month recheck of PSA.  Recheck today. Slight dribbling only if he holds it for too long (urinary urgency).  Vitamin D deficiency:  Patient had been taking MVI daily and a separate D.  Last check was 34 in 06/2020 (only on MVI at that point).  Hasn't been taking these as regularly recently, as mentioned above.  Depression/anxiety--denies significant changes.  He used to see Dr. KaToy Care He felt like all she did was push pills, and he recalls being told his depression was resistant, and the only thing left was ECT.  He hadn't been getting counseling. Today he reports that he found a hobby he enjoys-- meAdvertising copywriterStopped recently due to seeing so many ticks.  Still feels lonely.   PMH, PSH, SH reviewed  Outpatient Encounter Medications as of 09/19/2021  Medication Sig Note   Ascorbic Acid (VITAMIN C) 1000 MG tablet Take 1,000 mg by mouth daily. 09/19/2021: Just started back-was off for 2 weeks   b complex vitamins capsule Take 1 capsule by mouth daily. 09/19/2021: Just started back-was off for 2 weeks    glucose blood test strip Use as instructed    Insulin Pen Needle 32G X 4 MM MISC 1 each by Does not apply route 2 (two) times daily.    Lancets (ONETOUCH ULTRASOFT) lancets Use as instructed    LEVEMIR FLEXPEN 100 UNIT/ML FlexPen ADMINISTER  18 TO 21 UNITS UNDER THE SKIN DAILY 09/19/2021: Takes 18 U daily   levothyroxine (SYNTHROID) 137 MCG tablet Take 137 mcg by mouth daily before breakfast. 09/19/2021: Just started back couple days ago-was off for 1 week   loratadine (CLARITIN) 10 MG tablet Take 10 mg by mouth daily.    Multiple Vitamins-Minerals (CENTRUM SILVER PO) Take 1 tablet by mouth daily. 09/19/2021: Just started back-was off for 2 weeks    Multiple Vitamins-Minerals (ZINC PO) Take 1 tablet by mouth daily. 09/19/2021: Just started back-was off for 2 weeks    rosuvastatin (CRESTOR) 10 MG tablet TAKE 1 TABLET(10 MG) BY  MOUTH DAILY    rosuvastatin (CRESTOR) 10 MG tablet TAKE 1 TABLET(10 MG) BY MOUTH DAILY 09/19/2021: Just started back a couple days ago-was off for 3 weeks   VITAMIN D PO Take by mouth. 09/19/2021: 1000iu-Just started back-was off for 2 weeks    Zn-Pyg Afri-Nettle-Saw Palmet (SAW PALMETTO COMPLEX PO) Take 1 capsule by mouth daily. 01/15/2021: Unsure of dose or type of saw palmetto   ibuprofen (ADVIL,MOTRIN) 200 MG tablet Take 400 mg by mouth every 6 (six) hours as needed. Reported on 09/20/2015 (Patient not taking: Reported on 09/19/2021) 09/19/2021: prn   [DISCONTINUED] amoxicillin-clavulanate (AUGMENTIN) 875-125 MG tablet Take 1 tablet by mouth every 12 (twelve) hours.    No facility-administered encounter medications on file as of 09/19/2021.   No Known Allergies  ROS: Denies fever, chills, URI symptoms, headaches, dizziness, shortness of breath, chest pain.  Denies nausea, vomiting, bowel changes, bleeding, bruising, rash. Chronic depression, anxiety, no significant worsening. +intentional weight loss See HPI   PHYSICAL EXAM:  BP 130/80   Pulse 80   Ht '5\' 10"'  (1.778 m)   Wt 189 lb (85.7 kg)   BMI 27.12 kg/m   Wt Readings from Last 3 Encounters:  09/19/21 189 lb (85.7 kg)  04/09/21 204 lb 9.6 oz (92.8 kg)  02/26/21 197 lb 12.8 oz (89.7 kg)   Well-appearing male, in no distress HEENT: conjunctiva and sclera are clear, EOMI. Neck: no lymphadenopathy, thyromegaly, carotid bruit Heart: regular rate and rhythm Lungs: clear bilaterally Back: no spinal or CVA tenderness Abdomen: soft, nontender, no mass Extremities: 2+ pulses, no edema.   Neuro: alert and oriented, normal' \gait'  Psych: Moods are quite good today. Normal hygiene, grooming, speech  Lab Results  Component Value Date   HGBA1C 5.4 09/19/2021       09/19/2021   11:15 AM 04/09/2021    2:58 PM 01/15/2021   10:59 AM 11/17/2017    2:07 PM  Depression screen PHQ 2/9  Decreased Interest 3 0 3 3  Down, Depressed, Hopeless  3 0 3 3  PHQ - 2 Score 6 0 6 6  Altered sleeping 0  2 0  Tired, decreased energy '3  1 3  ' Change in appetite '1  3 1  ' Feeling bad or failure about yourself  '3  2 3  ' Trouble concentrating 0  0 0  Moving slowly or fidgety/restless 3  0 0  Suicidal thoughts 0   3  PHQ-9 Score '16  14 16  ' Difficult doing work/chores Extremely dIfficult  Very difficult       09/19/2021   11:04 AM  GAD 7 : Generalized Anxiety Score  Nervous, Anxious, on Edge 3  Control/stop worrying 3  Worry too much - different things 3  Trouble relaxing 3  Restless 3  Easily annoyed or irritable 3  Afraid - awful might happen 0  Total  GAD 7 Score 18  Anxiety Difficulty Extremely difficult     ASSESSMENT/PLAN:  Elevated PSA - Recheck today, along with free PSA. Refer to urologist if increasing - Plan: PSA, total and free  Type 2 diabetes mellitus with hyperglycemia, with long-term current use of insulin ([place]) - well controlled.  No hypoglycemia.  Consider cutting back on insulin if he maintains weight loss and sugars remain good (esp if any hypoglycemia) - Plan: HgB A1c, Comprehensive metabolic panel  Mixed dyslipidemia - continue crestor, low cholesterol diet.  recheck at CPE  Hypothyroidism, unspecified type - some noncompliance. Checking TSH now to ensure not low (given wt loss).  If elevated, cont current dose and recheck at CPE - Plan: TSH  Vitamin D deficiency - some noncompliance recently, but added D3 to his MVI  Medication monitoring encounter - Plan: CBC with Differential/Platelet, Comprehensive metabolic panel, TSH  Dysthymic disorder - Encouraged counseling, and f/u with Dr. Toy Care if meds desired/needed  Anxiety - He has some irritability/impatience with others, also slight paranoia (regarding vaccines). Encouraged counseling/psych  c-met, cbc, TSH, PSA with reflex to free  Patient is to switch vitamins to evenings, take prescription meds in mornings (30 minutes apart from thyroid med, if  possible. He had reported taking a Mg supplement, in addition to eating high Mg foods.  He will stop the Mg (not needed).  Encouraged to get Shingrix, and reassured that that is ALL he will get if that is all he agrees to, at the pharmacy.   Schedule CPE/AWV 6 mos

## 2021-09-19 ENCOUNTER — Encounter: Payer: Self-pay | Admitting: Family Medicine

## 2021-09-19 ENCOUNTER — Ambulatory Visit (INDEPENDENT_AMBULATORY_CARE_PROVIDER_SITE_OTHER): Payer: Medicare HMO | Admitting: Family Medicine

## 2021-09-19 VITALS — BP 130/80 | HR 80 | Ht 70.0 in | Wt 189.0 lb

## 2021-09-19 DIAGNOSIS — E782 Mixed hyperlipidemia: Secondary | ICD-10-CM

## 2021-09-19 DIAGNOSIS — E1165 Type 2 diabetes mellitus with hyperglycemia: Secondary | ICD-10-CM | POA: Diagnosis not present

## 2021-09-19 DIAGNOSIS — R972 Elevated prostate specific antigen [PSA]: Secondary | ICD-10-CM | POA: Diagnosis not present

## 2021-09-19 DIAGNOSIS — F341 Dysthymic disorder: Secondary | ICD-10-CM

## 2021-09-19 DIAGNOSIS — E039 Hypothyroidism, unspecified: Secondary | ICD-10-CM

## 2021-09-19 DIAGNOSIS — Z5181 Encounter for therapeutic drug level monitoring: Secondary | ICD-10-CM | POA: Diagnosis not present

## 2021-09-19 DIAGNOSIS — F419 Anxiety disorder, unspecified: Secondary | ICD-10-CM | POA: Diagnosis not present

## 2021-09-19 DIAGNOSIS — Z794 Long term (current) use of insulin: Secondary | ICD-10-CM

## 2021-09-19 DIAGNOSIS — E559 Vitamin D deficiency, unspecified: Secondary | ICD-10-CM

## 2021-09-19 LAB — POCT GLYCOSYLATED HEMOGLOBIN (HGB A1C): Hemoglobin A1C: 5.4 % (ref 4.0–5.6)

## 2021-09-19 NOTE — Patient Instructions (Addendum)
Please seek out a therapist (and possibly go back to Dr. Karie Fetch are new treatments available compared to when you saw her last.  Your thyroid medications should be taken 30 minutes separate from your other medications--only the multivitamin (or anything with calcium) should be separated by longer (4 hours). Take all your medicines in the morning (ideally 30 mins apart from the thyroid, but if you're forgetting, then take them together).  Take your vitamins at night--less important if you miss these.  I recommend getting the new shingles vaccine (Shingrix). Since you have Medicare, you will need to get this from the pharmacy, as it is covered by Part D. This is a series of 2 injections, spaced 2 months apart.   This should be separated from other vaccines by at least 2 weeks.

## 2021-09-20 LAB — CBC WITH DIFFERENTIAL/PLATELET
Basophils Absolute: 0 10*3/uL (ref 0.0–0.2)
Basos: 1 %
EOS (ABSOLUTE): 0.1 10*3/uL (ref 0.0–0.4)
Eos: 1 %
Hematocrit: 45.2 % (ref 37.5–51.0)
Hemoglobin: 15.8 g/dL (ref 13.0–17.7)
Immature Grans (Abs): 0 10*3/uL (ref 0.0–0.1)
Immature Granulocytes: 0 %
Lymphocytes Absolute: 1.2 10*3/uL (ref 0.7–3.1)
Lymphs: 17 %
MCH: 29.9 pg (ref 26.6–33.0)
MCHC: 35 g/dL (ref 31.5–35.7)
MCV: 85 fL (ref 79–97)
Monocytes Absolute: 0.7 10*3/uL (ref 0.1–0.9)
Monocytes: 10 %
Neutrophils Absolute: 5 10*3/uL (ref 1.4–7.0)
Neutrophils: 71 %
Platelets: 274 10*3/uL (ref 150–450)
RBC: 5.29 x10E6/uL (ref 4.14–5.80)
RDW: 13 % (ref 11.6–15.4)
WBC: 7.1 10*3/uL (ref 3.4–10.8)

## 2021-09-20 LAB — COMPREHENSIVE METABOLIC PANEL
ALT: 12 IU/L (ref 0–44)
AST: 21 IU/L (ref 0–40)
Albumin/Globulin Ratio: 2 (ref 1.2–2.2)
Albumin: 4.8 g/dL (ref 3.8–4.8)
Alkaline Phosphatase: 68 IU/L (ref 44–121)
BUN/Creatinine Ratio: 19 (ref 10–24)
BUN: 17 mg/dL (ref 8–27)
Bilirubin Total: 0.8 mg/dL (ref 0.0–1.2)
CO2: 25 mmol/L (ref 20–29)
Calcium: 10 mg/dL (ref 8.6–10.2)
Chloride: 101 mmol/L (ref 96–106)
Creatinine, Ser: 0.91 mg/dL (ref 0.76–1.27)
Globulin, Total: 2.4 g/dL (ref 1.5–4.5)
Glucose: 88 mg/dL (ref 70–99)
Potassium: 5.4 mmol/L — ABNORMAL HIGH (ref 3.5–5.2)
Sodium: 139 mmol/L (ref 134–144)
Total Protein: 7.2 g/dL (ref 6.0–8.5)
eGFR: 94 mL/min/{1.73_m2} (ref >59)

## 2021-09-20 LAB — TSH: TSH: 2.41 u[IU]/mL (ref 0.450–4.500)

## 2021-09-20 LAB — PSA, TOTAL AND FREE
PSA, Free Pct: 23.6 %
PSA, Free: 0.66 ng/mL
Prostate Specific Ag, Serum: 2.8 ng/mL (ref 0.0–4.0)

## 2021-09-26 ENCOUNTER — Encounter: Payer: Self-pay | Admitting: Family Medicine

## 2021-10-22 ENCOUNTER — Other Ambulatory Visit: Payer: Self-pay | Admitting: Family Medicine

## 2021-11-23 ENCOUNTER — Other Ambulatory Visit: Payer: Self-pay | Admitting: Family Medicine

## 2022-01-14 ENCOUNTER — Other Ambulatory Visit: Payer: Self-pay | Admitting: Family Medicine

## 2022-01-14 DIAGNOSIS — E782 Mixed hyperlipidemia: Secondary | ICD-10-CM

## 2022-01-23 ENCOUNTER — Telehealth: Payer: Self-pay | Admitting: Family Medicine

## 2022-01-23 ENCOUNTER — Other Ambulatory Visit: Payer: Self-pay | Admitting: Family Medicine

## 2022-01-23 NOTE — Telephone Encounter (Signed)
Pt calling in and needs a refill on his levemir to [facility], [address] AT [address]

## 2022-02-19 DIAGNOSIS — M25512 Pain in left shoulder: Secondary | ICD-10-CM | POA: Diagnosis not present

## 2022-03-09 ENCOUNTER — Encounter (HOSPITAL_COMMUNITY): Payer: Self-pay

## 2022-03-09 ENCOUNTER — Ambulatory Visit (HOSPITAL_COMMUNITY)
Admission: EM | Admit: 2022-03-09 | Discharge: 2022-03-09 | Disposition: A | Discharge status: Home / Self Care | Payer: Medicare HMO | Attending: Emergency Medicine | Admitting: Emergency Medicine

## 2022-03-09 DIAGNOSIS — S39012A Strain of muscle, fascia and tendon of lower back, initial encounter: Secondary | ICD-10-CM | POA: Diagnosis not present

## 2022-03-09 MED ORDER — NAPROXEN 500 MG PO TABS: 500.0000 mg | ORAL_TABLET | Freq: Two times a day (BID) | ORAL | 0 refills | Status: DC

## 2022-03-09 MED ORDER — KETOROLAC TROMETHAMINE 30 MG/ML IJ SOLN
INTRAMUSCULAR | Status: AC
  Filled 2022-03-09: qty 1

## 2022-03-09 MED ORDER — KETOROLAC TROMETHAMINE 30 MG/ML IJ SOLN
30.0000 mg | Freq: Once | INTRAMUSCULAR | Status: AC
  Administered 2022-03-09: 30 mg via INTRAMUSCULAR

## 2022-03-09 MED ORDER — METHOCARBAMOL 500 MG PO TABS: 500.0000 mg | ORAL_TABLET | Freq: Two times a day (BID) | ORAL | 0 refills | Status: DC

## 2022-03-09 NOTE — ED Provider Notes (Signed)
[facility]    CSN: [id number] Arrival date & time: 03/09/22  1615      History   Chief Complaint Chief Complaint  Patient presents with   Back Pain    HPI Johnathan Estrada is a 65 y.o. male.  Patient presents complaining of right sided lower back pain that started 3 days ago.  Patient denies any fall or trauma.  Patient reports that he believes onset of symptoms began due to having his gun holster along the right side of his back, he states that each time he does this he will have severe back pain that tends to subside within a few days.  He states that this episode is continuing and will not resolve.  Patient denies any radiation of pain down his lower extremities.  Patient denies any incontinence of bowel or bladder.  Patient denies any urinary symptoms.  Patient states he has a worsening of pain when he is raising from a chair or attempted to turn his torso.  He has taken ibuprofen with minimal relief of symptoms.    Back Pain   Past Medical History:  Diagnosis Date   Allergic rhinitis, cause unspecified    s/p immunotherapy   Depression    sees Dr. Toy Care   Diabetes [facility]) 05/2015   Diverticulosis 7/09   Head injury 3/08   with facial fracture   Internal hemorrhoid 7/09   Male hypogonadism 2013   low testosterone   Unspecified hypothyroidism     Patient Active Problem List   Diagnosis Date Noted   Mixed dyslipidemia 01/13/2017   Type 2 diabetes mellitus with hyperglycemia, with long-term current use of insulin ([place]) 01/13/2017   Right inguinal hernia 01/13/2014   Noncompliance with medication regimen 07/14/2012   Hypogonadism male 06/07/2011   Vitamin D deficiency 06/07/2011   Decreased libido 06/06/2011   Fatigue 09/19/2010   Hypothyroidism 09/19/2010   Depression 09/19/2010    Past Surgical History:  Procedure Laterality Date   CARPAL TUNNEL RELEASE Right    right   COLONOSCOPY  10/07/2007   Dr. Michail Sermon; diverticulosis and small internal  hemorrhoids; repeat 10 yrs   [place]  03/26/1999   Dr. Harlene Ramus HERNIA REPAIR Right 03/11/2017   Procedure: HERNIA REPAIR INGUINAL ERAS PATWAY;  Surgeon: Fanny Skates, MD;  Location: [facility];  Service: General;  Laterality: Right;   INSERTION OF MESH Right 03/11/2017   Procedure: INSERTION OF MESH;  Surgeon: Fanny Skates, MD;  Location: [facility];  Service: General;  Laterality: Right;   KNEE SURGERY Right 10/23/2005   right, arthroscopic; Dr. Noemi Chapel   ORIF FINGER FRACTURE Right    R 3rd and 4th fingers   SHOULDER SURGERY     bilateral (10/05 L; 8/07 R)       Home Medications    Prior to Admission medications   Medication Sig Start Date End Date Taking? Authorizing Provider  methocarbamol (ROBAXIN) 500 MG tablet Take 1 tablet (500 mg total) by mouth 2 (two) times daily. 03/09/22  Yes Flossie Dibble, NP  naproxen (NAPROSYN) 500 MG tablet Take 1 tablet (500 mg total) by mouth 2 (two) times daily. 03/09/22  Yes Flossie Dibble, NP  Ascorbic Acid (VITAMIN C) 1000 MG tablet Take 1,000 mg by mouth daily.    [provider]  b complex vitamins capsule Take 1 capsule by mouth daily.    [provider]  glucose  blood test strip Use as instructed 07/03/21   Joselyn Arrow, MD  ibuprofen (ADVIL,MOTRIN) 200 MG tablet Take 400 mg by mouth every 6 (six) hours as needed. Reported on 09/20/2015 Patient not taking: Reported on 09/19/2021    [provider]  Insulin Pen Needle 32G X 4 MM MISC 1 each by Does not apply route 2 (two) times daily. 02/05/21   Joselyn Arrow, MD  Lancets [facility] ULTRASOFT) lancets Use as instructed 06/14/15   Joselyn Arrow, MD  LEVEMIR FLEXPEN 100 UNIT/ML FlexPen ADMINISTER 18 TO 21 UNITS UNDER THE SKIN DAILY 01/23/22   Joselyn Arrow, MD  levothyroxine (SYNTHROID) 137 MCG tablet Take 137 mcg by mouth daily before breakfast.    [provider]  loratadine (CLARITIN)  10 MG tablet Take 10 mg by mouth daily.    [provider]  Multiple Vitamins-Minerals (CENTRUM SILVER PO) Take 1 tablet by mouth daily.    [provider]  Multiple Vitamins-Minerals (ZINC PO) Take 1 tablet by mouth daily.    [provider]  rosuvastatin (CRESTOR) 10 MG tablet TAKE 1 TABLET(10 MG) BY MOUTH DAILY 07/16/21   Joselyn Arrow, MD  rosuvastatin (CRESTOR) 10 MG tablet TAKE 1 TABLET(10 MG) BY MOUTH DAILY 01/15/22   Joselyn Arrow, MD  VITAMIN D PO Take by mouth.    [provider]  Zn-Pyg Afri-Nettle-Saw Palmet (SAW PALMETTO COMPLEX PO) Take 1 capsule by mouth daily.    [provider]    Family History Family History  Problem Relation Age of Onset   Diabetes Mother        borderline   Heart disease Mother        irregular heartbeat   COPD Mother    Dementia Mother    Diabetes Brother        pt denies this at 03/2021 visit   Stroke Maternal Grandmother    Kidney disease Maternal Grandfather    Heart disease Maternal Aunt    Cancer Maternal Uncle        colon cancer   Heart disease Maternal Uncle     Social History Social History   Tobacco Use   Smoking status: Never   Smokeless tobacco: Never  Vaping Use   Vaping Use: Never used  Substance Use Topics   Alcohol use: No   Drug use: Not Currently    Types: Marijuana    Comment: no marijuana since 1986     Allergies   Patient has no known allergies.   Review of Systems Review of Systems  Musculoskeletal:  Positive for back pain.     Physical Exam Triage Vital Signs ED Triage Vitals  Enc Vitals Group     BP 03/09/22 1741 (!) 152/81     Pulse Rate 03/09/22 1741 70     Resp 03/09/22 1741 16     Temp 03/09/22 1741 98.2 F (36.8 C)     Temp Source 03/09/22 1741 Oral     SpO2 03/09/22 1741 96 %     Weight --      Height --      Head Circumference --      Peak Flow --      Pain Score 03/09/22 1744 9     Pain Loc --      Pain Edu? --      Excl. in GC? --    No  data found.  Updated Vital Signs BP (!) 152/81 (BP Location: Right Arm)   Pulse 70  Temp 98.2 F (36.8 C) (Oral)   Resp 16   SpO2 96%     Physical Exam Vitals and nursing note reviewed.  Musculoskeletal:     Cervical back: Normal.     Thoracic back: Normal.     Lumbar back: No swelling, edema, signs of trauma, lacerations, spasms, tenderness or bony tenderness. Decreased range of motion. Negative right straight leg raise test and negative left straight leg raise test. No scoliosis.     Comments: Patient has reproducible pain with passive motion of his right leg and turning of torso.  No changes to skin on lumbar back.   Neurological:     General: No focal deficit present.      UC Treatments / Results  Labs (all labs ordered are listed, but only abnormal results are displayed) Labs Reviewed - No data to display  EKG   Radiology No results found.  Procedures Procedures (including critical care time)  Medications Ordered in UC Medications  ketorolac (TORADOL) 30 MG/ML injection 30 mg (has no administration in time range)    Initial Impression / Assessment and Plan / UC Course  I have reviewed the triage vital signs and the nursing notes.  Pertinent labs & imaging results that were available during my care of the patient were reviewed by me and considered in my medical decision making (see chart for details).     Lumbar strain.Marland KitchenMarland KitchenRobaxin and Naproxen. No neurological symptoms.  Final Clinical Impressions(s) / UC Diagnoses   Final diagnoses:  Strain of lumbar region, initial encounter     Discharge Instructions      Robaxin is a muscle relaxant, its been sent to the pharmacy.  You may take the muscle relaxant 2 times daily, place 12 hours in between each dose.  Please do not operate any heavy machinery or drive a car after taking this medication. You may start taking the Robaxin tonight.   Naproxen has been sent to the pharmacy, you may take this 2 times  daily as needed with 12 hours in between each doses. You may start taking the Naproxen tomorrow.   As discussed, you may follow up with your PCP.      ED Prescriptions     Medication Sig Dispense Auth. Provider   methocarbamol (ROBAXIN) 500 MG tablet Take 1 tablet (500 mg total) by mouth 2 (two) times daily. 20 tablet Flossie Dibble, NP   naproxen (NAPROSYN) 500 MG tablet Take 1 tablet (500 mg total) by mouth 2 (two) times daily. 30 tablet Flossie Dibble, NP      PDMP not reviewed this encounter.

## 2022-03-09 NOTE — Discharge Instructions (Addendum)
Robaxin is a muscle relaxant, its been sent to the pharmacy.  You may take the muscle relaxant 2 times daily, place 12 hours in between each dose.  Please do not operate any heavy machinery or drive a car after taking this medication. You may start taking the Robaxin tonight.   Naproxen has been sent to the pharmacy, you may take this 2 times daily as needed with 12 hours in between each doses. You may start taking the Naproxen tomorrow.   As discussed, you may follow up with your PCP.

## 2022-03-09 NOTE — ED Triage Notes (Signed)
Pt states right sided lower back pain for the past 3 days. Took motrin at home with some relief.

## 2022-03-27 ENCOUNTER — Telehealth: Payer: Self-pay | Admitting: Family Medicine

## 2022-03-27 NOTE — Telephone Encounter (Signed)
[facility] called and states that insurance will no longer cover levemir and needs PA for this. Pt has been out of meds. Please complete PA for this medication

## 2022-03-27 NOTE — Telephone Encounter (Signed)
Pt left voicemail that Levemir no longer  covered by insurance

## 2022-03-28 NOTE — Telephone Encounter (Signed)
P.A. Ernest Mallick completed, preferred meds now are Lantus, Lantus solostar, Toujeo & Tyler Aas

## 2022-03-29 MED ORDER — LANTUS SOLOSTAR 100 UNIT/ML ~~LOC~~ SOPN: PEN_INJECTOR | SUBCUTANEOUS | 1 refills | Status: DC

## 2022-03-29 NOTE — Telephone Encounter (Signed)
done

## 2022-03-29 NOTE — Telephone Encounter (Signed)
Form that we received from [facility] says that Lantus is covered. So I have sent in Lantus solostar pen to pharmacy. Pt will do 18-21 units daily just like his Levemir flexpen

## 2022-03-29 NOTE — Telephone Encounter (Signed)
Patient has been without medication since Tuesday.  Levemir Flexpen 100unit/ml is not covered under his part D plan.   Preferred drugs are:  Toujeo Max U-300 solostar pen [employer] U-300 pen [place] Flextouch U-100 pen [place] flextouch U-200 pen Tresiba U-100 soulution  Please advise as patient as called and needs something for his DM.

## 2022-04-07 NOTE — Progress Notes (Unsigned)
No chief complaint on file.     There were no vitals taken for this visit.  Wt Readings from Last 3 Encounters:  09/19/21 189 lb (85.7 kg)  04/09/21 204 lb 9.6 oz (92.8 kg)  02/26/21 197 lb 12.8 oz (89.7 kg)       ASSESSMENT/PLAN:   RF sildenafil 100mg   CPE/AWV scheduled 2/19

## 2022-04-08 ENCOUNTER — Encounter: Payer: Self-pay | Admitting: Family Medicine

## 2022-04-08 ENCOUNTER — Ambulatory Visit (INDEPENDENT_AMBULATORY_CARE_PROVIDER_SITE_OTHER): Payer: Medicare HMO | Admitting: Family Medicine

## 2022-04-08 VITALS — BP 122/74 | HR 64 | Ht 70.0 in | Wt 187.6 lb

## 2022-04-08 DIAGNOSIS — G478 Other sleep disorders: Secondary | ICD-10-CM

## 2022-04-08 DIAGNOSIS — M545 Low back pain, unspecified: Secondary | ICD-10-CM | POA: Diagnosis not present

## 2022-04-08 DIAGNOSIS — N529 Male erectile dysfunction, unspecified: Secondary | ICD-10-CM

## 2022-04-08 DIAGNOSIS — R0681 Apnea, not elsewhere classified: Secondary | ICD-10-CM

## 2022-04-08 DIAGNOSIS — R0683 Snoring: Secondary | ICD-10-CM | POA: Diagnosis not present

## 2022-04-08 MED ORDER — SILDENAFIL CITRATE 100 MG PO TABS: 50.0000 mg | ORAL_TABLET | Freq: Every day | ORAL | 11 refills | Status: DC | PRN

## 2022-04-08 MED ORDER — METHOCARBAMOL 500 MG PO TABS: 500.0000 mg | ORAL_TABLET | Freq: Three times a day (TID) | ORAL | 0 refills | Status: DC | PRN

## 2022-04-08 NOTE — Patient Instructions (Addendum)
Please do not repeat what keeps causing your back pain.  If you have to carry something heavy, make sure that it is carried in a way where the weight is evenly distributed (ie backpack on both shoulders). Be sure to do exercises to strengthen your core, to try and prevent further problems. If your pain doesn't improve within the next few days, physical therapy is a good option.  Stop the ibuprofen.  You should not be taking any ibuprofen/advil/motrin/Aleve/naproxen (over the counter), Goody/BC powder while taking prescription naproxen. This can increase your risk for stomach issues (gastritis, ulcers) and damage your kidneys).  Take the naproxen that you have at home twice daily with food until you are better (if you run out, this is similar to taking 2 of the over-the-counter naproxen/aleve). Take the methocarbamol up to three times day, only if needed for back spasm.  I recommend heat 3x/day, followed by stretches and massage. You can also try over-the-counter treatments such as SalonPas with lidocaine, or biofreeze. Return if your back pain isn't improving.   We will refer you for a sleep study.  Please try and avoid sleeping on your back.  I recommend you discuss any ADD concerns with Dr. Toy Care.  Please get the shingles vaccine as we previously discussed.

## 2022-05-10 ENCOUNTER — Ambulatory Visit (HOSPITAL_BASED_OUTPATIENT_CLINIC_OR_DEPARTMENT_OTHER): Payer: Medicare HMO | Admitting: Internal Medicine

## 2022-05-10 DIAGNOSIS — R0683 Snoring: Secondary | ICD-10-CM

## 2022-05-10 DIAGNOSIS — G478 Other sleep disorders: Secondary | ICD-10-CM

## 2022-05-10 DIAGNOSIS — R0681 Apnea, not elsewhere classified: Secondary | ICD-10-CM

## 2022-05-13 ENCOUNTER — Ambulatory Visit: Payer: Medicare HMO | Admitting: Family Medicine

## 2022-05-17 ENCOUNTER — Other Ambulatory Visit: Payer: Self-pay | Admitting: Family Medicine

## 2022-05-17 ENCOUNTER — Ambulatory Visit (HOSPITAL_BASED_OUTPATIENT_CLINIC_OR_DEPARTMENT_OTHER): Payer: Medicare HMO | Attending: Family Medicine | Admitting: Internal Medicine

## 2022-05-17 DIAGNOSIS — R0683 Snoring: Secondary | ICD-10-CM

## 2022-05-17 NOTE — Telephone Encounter (Signed)
Pt has an appt in April

## 2022-05-17 NOTE — Telephone Encounter (Signed)
Pt left message needs refill,  Johnathan Estrada refilling, pt informed

## 2022-05-21 ENCOUNTER — Telehealth: Payer: Self-pay | Admitting: Internal Medicine

## 2022-05-21 NOTE — Telephone Encounter (Signed)
Pt called and states that he has done 2 sleep studies with [facility] and neither has worked for him. He finished the 2nd one on Monday and the lights didn't work. The tubing was 10 foot long and was all tangled up and the machines serial numbers have been taped back on. He said he was told he could do 1 more try but he thinks they are abunch of crap and doesn't know if he should go with them again or try another company. Verdene Lennert he would like you to call him and then decide but wants me to send to both cma and Dr. Tomi Bamberger

## 2022-05-22 ENCOUNTER — Ambulatory Visit: Payer: Medicare HMO | Admitting: Family Medicine

## 2022-05-27 ENCOUNTER — Other Ambulatory Visit: Payer: Self-pay | Admitting: *Deleted

## 2022-05-27 NOTE — Telephone Encounter (Signed)
Spoke with patient and would like to switch to [facility]. Ordered.

## 2022-06-19 ENCOUNTER — Telehealth: Payer: Self-pay | Admitting: *Deleted

## 2022-06-19 DIAGNOSIS — Z794 Long term (current) use of insulin: Secondary | ICD-10-CM

## 2022-06-19 DIAGNOSIS — E039 Hypothyroidism, unspecified: Secondary | ICD-10-CM

## 2022-06-19 DIAGNOSIS — Z125 Encounter for screening for malignant neoplasm of prostate: Secondary | ICD-10-CM

## 2022-06-19 DIAGNOSIS — E559 Vitamin D deficiency, unspecified: Secondary | ICD-10-CM

## 2022-06-19 DIAGNOSIS — R972 Elevated prostate specific antigen [PSA]: Secondary | ICD-10-CM

## 2022-06-19 DIAGNOSIS — E782 Mixed hyperlipidemia: Secondary | ICD-10-CM

## 2022-06-19 DIAGNOSIS — Z5181 Encounter for therapeutic drug level monitoring: Secondary | ICD-10-CM

## 2022-06-19 NOTE — Telephone Encounter (Signed)
Called patient to follow up on a fax from [employer], they were unsuccessful in trying to get a hold of him. Patient given number and he will call and schedule today.

## 2022-06-19 NOTE — Telephone Encounter (Signed)
Patient is scheduled for CPE Monday afternoon, would like to come tomorrow at 8:45 for fasting labs-orders needed please and thanks.

## 2022-06-19 NOTE — Telephone Encounter (Signed)
done

## 2022-06-20 ENCOUNTER — Other Ambulatory Visit: Payer: Medicare HMO

## 2022-06-20 DIAGNOSIS — Z5181 Encounter for therapeutic drug level monitoring: Secondary | ICD-10-CM

## 2022-06-20 DIAGNOSIS — E559 Vitamin D deficiency, unspecified: Secondary | ICD-10-CM | POA: Diagnosis not present

## 2022-06-20 DIAGNOSIS — E782 Mixed hyperlipidemia: Secondary | ICD-10-CM | POA: Diagnosis not present

## 2022-06-20 DIAGNOSIS — R972 Elevated prostate specific antigen [PSA]: Secondary | ICD-10-CM | POA: Diagnosis not present

## 2022-06-20 DIAGNOSIS — Z125 Encounter for screening for malignant neoplasm of prostate: Secondary | ICD-10-CM

## 2022-06-20 DIAGNOSIS — E1165 Type 2 diabetes mellitus with hyperglycemia: Secondary | ICD-10-CM | POA: Diagnosis not present

## 2022-06-20 DIAGNOSIS — E039 Hypothyroidism, unspecified: Secondary | ICD-10-CM

## 2022-06-20 DIAGNOSIS — Z794 Long term (current) use of insulin: Secondary | ICD-10-CM | POA: Diagnosis not present

## 2022-06-20 LAB — LIPID PANEL

## 2022-06-22 LAB — COMPREHENSIVE METABOLIC PANEL
ALT: 11 IU/L (ref 0–44)
AST: 16 IU/L (ref 0–40)
Albumin/Globulin Ratio: 1.7 (ref 1.2–2.2)
Albumin: 4.3 g/dL (ref 3.9–4.9)
Alkaline Phosphatase: 71 IU/L (ref 44–121)
BUN/Creatinine Ratio: 17 (ref 10–24)
BUN: 17 mg/dL (ref 8–27)
Bilirubin Total: 0.7 mg/dL (ref 0.0–1.2)
CO2: 25 mmol/L (ref 20–29)
Calcium: 9.2 mg/dL (ref 8.6–10.2)
Chloride: 102 mmol/L (ref 96–106)
Creatinine, Ser: 0.98 mg/dL (ref 0.76–1.27)
Globulin, Total: 2.5 g/dL (ref 1.5–4.5)
Glucose: 101 mg/dL — ABNORMAL HIGH (ref 70–99)
Potassium: 4.4 mmol/L (ref 3.5–5.2)
Sodium: 140 mmol/L (ref 134–144)
Total Protein: 6.8 g/dL (ref 6.0–8.5)
eGFR: 85 mL/min/{1.73_m2} (ref >59)

## 2022-06-22 LAB — HEMOGLOBIN A1C
Est. average glucose Bld gHb Est-mCnc: 120 mg/dL
Hgb A1c MFr Bld: 5.8 % — ABNORMAL HIGH (ref 4.8–5.6)

## 2022-06-22 LAB — LIPID PANEL
Chol/HDL Ratio: 3.8 ratio (ref 0.0–5.0)
Cholesterol, Total: 141 mg/dL (ref 100–199)
HDL: 37 mg/dL — ABNORMAL LOW (ref >39)
LDL Chol Calc (NIH): 80 mg/dL (ref 0–99)
Triglycerides: 137 mg/dL (ref 0–149)
VLDL Cholesterol Cal: 24 mg/dL (ref 5–40)

## 2022-06-22 LAB — CBC WITH DIFFERENTIAL/PLATELET
Basophils Absolute: 0 10*3/uL (ref 0.0–0.2)
Basos: 1 %
EOS (ABSOLUTE): 0.2 10*3/uL (ref 0.0–0.4)
Eos: 3 %
Hematocrit: 44.1 % (ref 37.5–51.0)
Hemoglobin: 14.9 g/dL (ref 13.0–17.7)
Immature Grans (Abs): 0 10*3/uL (ref 0.0–0.1)
Immature Granulocytes: 0 %
Lymphocytes Absolute: 1 10*3/uL (ref 0.7–3.1)
Lymphs: 16 %
MCH: 29.5 pg (ref 26.6–33.0)
MCHC: 33.8 g/dL (ref 31.5–35.7)
MCV: 87 fL (ref 79–97)
Monocytes Absolute: 0.6 10*3/uL (ref 0.1–0.9)
Monocytes: 10 %
Neutrophils Absolute: 4.2 10*3/uL (ref 1.4–7.0)
Neutrophils: 70 %
Platelets: 264 10*3/uL (ref 150–450)
RBC: 5.05 x10E6/uL (ref 4.14–5.80)
RDW: 13.2 % (ref 11.6–15.4)
WBC: 5.9 10*3/uL (ref 3.4–10.8)

## 2022-06-22 LAB — PSA TOTAL (REFLEX TO FREE): Prostate Specific Ag, Serum: 3.7 ng/mL (ref 0.0–4.0)

## 2022-06-22 LAB — MICROALBUMIN / CREATININE URINE RATIO
Creatinine, Urine: 121.4 mg/dL
Microalb/Creat Ratio: 6 mg/g creat (ref 0–29)
Microalbumin, Urine: 7.3 ug/mL

## 2022-06-22 LAB — TSH: TSH: 0.47 u[IU]/mL (ref 0.450–4.500)

## 2022-06-22 LAB — VITAMIN D 25 HYDROXY (VIT D DEFICIENCY, FRACTURES): Vit D, 25-Hydroxy: 78.3 ng/mL (ref 30.0–100.0)
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
## 2022-06-23 NOTE — Patient Instructions (Incomplete)
HEALTH MAINTENANCE RECOMMENDATIONS:  It is recommended that you get at least 30 minutes of aerobic exercise at least 5 days/week (for weight loss, you may need as much as 60-90 minutes). This can be any activity that gets your heart rate up. This can be divided in 10-15 minute intervals if needed, but try and build up your endurance at least once a week.  Weight bearing exercise is also recommended twice weekly.  Eat a healthy diet with lots of vegetables, fruits and fiber.  "Colorful" foods have a lot of vitamins (ie green vegetables, tomatoes, red peppers, etc).  Limit sweet tea, regular sodas and alcoholic beverages, all of which has a lot of calories and sugar.  Up to 2 alcoholic drinks daily may be beneficial for men (unless trying to lose weight, watch sugars).  Drink a lot of water.  Sunscreen of at least SPF 30 should be used on all sun-exposed parts of the skin when outside between the hours of 10 am and 4 pm (not just when at beach or pool, but even with exercise, golf, tennis, and yard work!)  Use a sunscreen that says "broad spectrum" so it covers both UVA and UVB rays, and make sure to reapply every 1-2 hours.  Remember to change the batteries in your smoke detectors when changing your clock times in the spring and fall.  Carbon monoxide detectors are recommended for your home.  Use your seat belt every time you are in a car, and please drive safely and not be distracted with cell phones and texting while driving.    Johnathan Estrada , Thank you for taking time to come for your Medicare Wellness Visit. I appreciate your ongoing commitment to your health goals. Please review the following plan we discussed and let me know if I can assist you in the future.   This is a list of the screening recommended for you and due dates:  Health Maintenance  Topic Date Due   COVID-19 Vaccine (1) Never done   Zoster (Shingles) Vaccine (1 of 2) Never done   Flu Shot  10/23/2021   Eye exam for diabetics   12/29/2021   Complete foot exam   04/09/2022   Hemoglobin A1C  12/21/2022   Yearly kidney function blood test for diabetes  06/20/2023   Yearly kidney health urinalysis for diabetes  06/20/2023   Medicare Annual Wellness Visit  06/24/2023   DTaP/Tdap/Td vaccine (4 - Td or Tdap) 10/24/2025   Colon Cancer Screening  08/09/2031   Pneumonia Vaccine  Completed   Hepatitis C Screening: USPSTF Recommendation to screen - Ages 84-79 yo.  Completed   HPV Vaccine  Aged Out   Please stop the B complex vitamin for 1-2 weeks prior to your bloodwork for your visits.  It may potentially falsely lower the TSH. I'm not changing your thyroid medication at this time. I do plan to recheck the test at your visit in 6 months. We will draw the TSH at your visit (along with the A1c). Stop the B complex 1-2 weeks prior. PUT THIS ON YOUR CALENDAR--STOP B12 THE WEEK BEFORE YOUR NEXT VISIT.  Your vitamin D level was very good on the 5000 IU daily. I suspect this might be too much during the warmer months where you get more sun exposure. Decrease your dose to either 5000 IU every OTHER day, or switch to 2000 IU daily.  Feel free to go back up to the 5000 IU daily dose over the winter, but not  all year round.  Try and incorporate some structure in your schedule--this will help you feel more focused and less scattered.  I recommend getting the new shingles vaccine (Shingrix). Since you have Medicare, you will need to get this from the pharmacy, as it is covered by Part D. This is a series of 2 injections, spaced 2 months apart.   This should be separated from other vaccines by at least 2 weeks.  Please get yearly high dose flu shots in the Fall (this one we can give at our office).  I also recommend the RSV vaccine. This needs to come from the pharmacy, and I recommend getting this in the Fall (you can do it the same day as your flu shot, for the pharmacy, vs separating the 2 vaccines by 2 weeks).  Be sure when you  are out hiking, or even ANY time you are out, that you have some candy, juice, or food with you.  If you're stuck in traffic, and your sugar starts to get low, you need to have something on hand to eat or drink to get it back up.  Definitely hike with a lot of food, and maybe even your meter. Bring juice as well.  Please bring [place] copies of your Living Will and [place] once completed and notarized so that it can be scanned into your medical chart.  Use the claritin D as needed for allergies. Avoid using afrin. If your allergies aren't well controlled with the claritin D, then use a nasal steroid spray such as Flonase (rather than a decongestant such as afrin, which it habit-forming).  Contact [facility] to schedule your home sleep study test.  Contact Dr. Zenia Resides office to schedule your diabetic eye exam.  Condoms are recommended for prevention of STD's. I recommend getting an STD test since you have had unprotected intercourse. Let us know when you are ready for this (blood and urine testing).

## 2022-06-24 ENCOUNTER — Encounter: Payer: Self-pay | Admitting: Family Medicine

## 2022-06-24 ENCOUNTER — Ambulatory Visit (INDEPENDENT_AMBULATORY_CARE_PROVIDER_SITE_OTHER): Payer: Medicare HMO | Admitting: Family Medicine

## 2022-06-24 VITALS — BP 122/74 | HR 60 | Ht 70.0 in | Wt 187.4 lb

## 2022-06-24 DIAGNOSIS — E039 Hypothyroidism, unspecified: Secondary | ICD-10-CM

## 2022-06-24 DIAGNOSIS — N529 Male erectile dysfunction, unspecified: Secondary | ICD-10-CM

## 2022-06-24 DIAGNOSIS — E1169 Type 2 diabetes mellitus with other specified complication: Secondary | ICD-10-CM | POA: Diagnosis not present

## 2022-06-24 DIAGNOSIS — Z113 Encounter for screening for infections with a predominantly sexual mode of transmission: Secondary | ICD-10-CM

## 2022-06-24 DIAGNOSIS — Z7251 High risk heterosexual behavior: Secondary | ICD-10-CM

## 2022-06-24 DIAGNOSIS — Z794 Long term (current) use of insulin: Secondary | ICD-10-CM

## 2022-06-24 DIAGNOSIS — E559 Vitamin D deficiency, unspecified: Secondary | ICD-10-CM

## 2022-06-24 DIAGNOSIS — E782 Mixed hyperlipidemia: Secondary | ICD-10-CM

## 2022-06-24 DIAGNOSIS — Z0000 Encounter for general adult medical examination without abnormal findings: Secondary | ICD-10-CM

## 2022-06-24 MED ORDER — ROSUVASTATIN CALCIUM 10 MG PO TABS: ORAL_TABLET | ORAL | 3 refills | Status: DC

## 2022-06-24 MED ORDER — LEVOTHYROXINE SODIUM 137 MCG PO TABS: ORAL_TABLET | ORAL | 1 refills | Status: DC

## 2022-06-27 ENCOUNTER — Other Ambulatory Visit: Payer: Medicare HMO

## 2022-07-01 ENCOUNTER — Other Ambulatory Visit: Payer: Self-pay | Admitting: Family Medicine

## 2022-09-04 ENCOUNTER — Telehealth: Payer: Self-pay

## 2022-09-04 NOTE — Telephone Encounter (Signed)
Please try reaching out to patient to let him know.  If unable to reach by phone, send MyChart message

## 2022-09-04 NOTE — Telephone Encounter (Signed)
A representative from [facility] came in today to let us know that they have tried to reach Johnathan Estrada several times and have been unable to reach him to get him scheduled for home sleep test.

## 2022-09-11 ENCOUNTER — Telehealth: Payer: Self-pay | Admitting: Family Medicine

## 2022-09-11 NOTE — Telephone Encounter (Signed)
I recommend that he go elsewhere. There are many other good ophthalmology groups in town. These include  [facility] [phone] [facility] [phone] [facility] [phone]) [phone]  Feel free to share other ophthos that are patients are happy with.
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
## 2022-09-16 NOTE — Telephone Encounter (Signed)
Pt called back about his recommendations informed him of the ones that you said,pt still wants to know if you had to choose one which one would you pick, if you had to go to one, he wants to go to the best eye dr he can go to he said,  I tried to inform him that those 3 where good ones and that I also had good experiences with [facility], but he wanted to know which one you would go to

## 2022-09-16 NOTE — Telephone Encounter (Signed)
[facility] 

## 2022-09-23 DIAGNOSIS — E113299 Type 2 diabetes mellitus with mild nonproliferative diabetic retinopathy without macular edema, unspecified eye: Secondary | ICD-10-CM | POA: Insufficient documentation

## 2022-09-27 DIAGNOSIS — H5203 Hypermetropia, bilateral: Secondary | ICD-10-CM | POA: Diagnosis not present

## 2022-09-27 DIAGNOSIS — H52203 Unspecified astigmatism, bilateral: Secondary | ICD-10-CM | POA: Diagnosis not present

## 2022-09-27 DIAGNOSIS — E113291 Type 2 diabetes mellitus with mild nonproliferative diabetic retinopathy without macular edema, right eye: Secondary | ICD-10-CM | POA: Diagnosis not present

## 2022-09-27 DIAGNOSIS — H2513 Age-related nuclear cataract, bilateral: Secondary | ICD-10-CM | POA: Diagnosis not present

## 2022-09-27 LAB — HM DIABETES EYE EXAM

## 2022-09-30 ENCOUNTER — Encounter: Payer: Self-pay | Admitting: *Deleted

## 2022-10-09 ENCOUNTER — Encounter: Payer: Self-pay | Admitting: Family Medicine

## 2022-10-15 ENCOUNTER — Other Ambulatory Visit: Payer: Self-pay | Admitting: Family Medicine

## 2022-12-24 NOTE — Progress Notes (Unsigned)
No chief complaint on file.  Patient presents for 6 month f/u on chronic problems  Hyperlipidemia:  Patient reports compliance with 10mg  rosuvastatin, and denies side effects. Lipids were controlled on this regimen per last check: Lab Results  Component Value Date   CHOL 141 06/20/2022   HDL 37 (L) 06/20/2022   LDLCALC 80 06/20/2022   TRIG 137 06/20/2022   CHOLHDL 3.8 06/20/2022     DM--Last A1c was 5.8 in 05/2022.  He is compliant with his 18 U of levemir/lantus. ***UPDATE Sugars are running   He denies polydipsia, polyuria or hypoglycemia He checks his feet regularly, denies concerns. Last diabetic eye exam was in July, 2024, retinopathy was noted.  Lab Results  Component Value Date   HGBA1C 5.8 (H) 06/20/2022    Hypothyroidism:  He reports in taking dose of thyroid medication daily.   He takes this on an empty stomach, and separates from other medications. No changes to hair/skin/nails/energy or weight. No changes to moods. Last TSH was on the low end.  Meds weren't adjusted, due for recheck today.  Lab Results  Component Value Date   TSH 0.470 06/20/2022    Vitamin D deficiency:  Last level was 78.3 in 05/2022 when taking 5000 IU of D3 daily, in addition to MVI daily.  Prior level was 34 in 06/2020, when taking MVI only. He was advised that he could decrease to 5000 IU every other day vs 2000 IU daily, and bump back up to 5000 IU in the winter. Today he reports he has been taking ***     Depression/anxiety--overall doing better. 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  He is no longer on any medications, and reports moods are good. PHQ-9 was 0 in 06/2022. (He previously saw Dr. 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 Evelene Croon no longer takes his insurance).   He previously reported waking up somewhat unrefreshed, but didn't feel tired during the day.  His girlfriend noted pauses in his  breathing. He was referred for sleep study to evaluate for OSA. Was referred to get this through [facility] (switched after he had issues with a home sleep test elsewhere).   PMH, PSH, SH reviewed   ROS: Denies fever, chills, URI symptoms, headaches, dizziness, shortness of breath, chest pain.   Denies nausea, vomiting, bowel changes, bleeding, bruising, rash. Moods  See HPI   PHYSICAL EXAM:  There were no vitals taken for this visit.  Wt Readings from Last 3 Encounters:  06/24/22 187 lb 6.4 oz (85 kg)  04/08/22 187 lb 9.6 oz (85.1 kg)  09/19/21 189 lb (85.7 kg)    Well-appearing male, in no distress HEENT: conjunctiva and sclera are clear, EOMI. Neck: no lymphadenopathy, thyromegaly, carotid bruit Heart: regular rate and rhythm Lungs: clear bilaterally Back: no spinal or CVA tenderness Abdomen: soft, nontender, no mass Extremities: 2+ pulses, no edema.  normal sensation to monofilament, no lesions. Neuro: alert and oriented, normal \\gait  Psych: Moods are quite good today. Normal hygiene, grooming, speech   ASSESSMENT/PLAN:  Flu, COVID--offer/decline Prevnar-20 to be offered if not getting TWO vaccines. RSV from pharmacy Shingrix from pharmacy  Did he ever get sleep study?? (I think we changed to [facility] after he had issues with the first place we sent to??)  DIABETIC FOOT EXAM  Phq only if worsening moods A1c TSH, free T4, and T3  AWAIT TSH RESULT TO FILL SYNTHROID Ensure not taking any Biotin Insulin  refill needed?   F/u 6 mos for CPE/AWV, sooner prn   He never came for STD testing, as was ordered at last visit. He had reported unprotected sex. Release these labs today??

## 2022-12-25 ENCOUNTER — Ambulatory Visit: Payer: Medicare HMO | Admitting: Family Medicine

## 2022-12-25 ENCOUNTER — Encounter: Payer: Self-pay | Admitting: Family Medicine

## 2022-12-25 VITALS — BP 102/68 | HR 72 | Ht 70.0 in | Wt 189.0 lb

## 2022-12-25 DIAGNOSIS — E1169 Type 2 diabetes mellitus with other specified complication: Secondary | ICD-10-CM

## 2022-12-25 DIAGNOSIS — Z23 Encounter for immunization: Secondary | ICD-10-CM

## 2022-12-25 DIAGNOSIS — E039 Hypothyroidism, unspecified: Secondary | ICD-10-CM

## 2022-12-25 DIAGNOSIS — E559 Vitamin D deficiency, unspecified: Secondary | ICD-10-CM | POA: Diagnosis not present

## 2022-12-25 DIAGNOSIS — Z794 Long term (current) use of insulin: Secondary | ICD-10-CM

## 2022-12-25 LAB — POCT GLYCOSYLATED HEMOGLOBIN (HGB A1C): Hemoglobin A1C: 5.6 % (ref 4.0–5.6)

## 2022-12-25 NOTE — Patient Instructions (Addendum)
Please get the RSV (Arexvy) vaccine from the pharmacy.  Wait 2 weeks from today's vaccines.  I also recommend getting the new shingles vaccine (Shingrix). Since you have Medicare, you will need to get this from the pharmacy, as it is covered by Part D. This is a series of 2 injections, spaced 2 months apart.   This should be separated from other vaccines by at least 2 weeks.   Decrease your insulin dose to 16 Units. Continue to monitor regularly. If your sugars go up to >130 in the mornings, then increase the dose back to 18. Always have some sugar candy with you and in your car, in case your sugars get low (or sugar tablets).  Please call back [facility] to schedule your home sleep test.  Try taking some Tylenol Arthritis before bed. You can also use SalonPas patches or voltaren gel, to see if these help with your pain.

## 2022-12-26 LAB — T4, FREE: Free T4: 1.58 ng/dL (ref 0.82–1.77)

## 2022-12-26 LAB — TSH: TSH: 0.414 u[IU]/mL — ABNORMAL LOW (ref 0.450–4.500)

## 2022-12-26 LAB — T3: T3, Total: 90 ng/dL (ref 71–180)

## 2022-12-26 MED ORDER — LEVOTHYROXINE SODIUM 125 MCG PO TABS: 125.0000 ug | ORAL_TABLET | Freq: Every day | ORAL | 1 refills | Status: DC

## 2022-12-27 ENCOUNTER — Other Ambulatory Visit: Payer: Self-pay | Admitting: Internal Medicine

## 2022-12-27 DIAGNOSIS — Z5181 Encounter for therapeutic drug level monitoring: Secondary | ICD-10-CM

## 2022-12-27 DIAGNOSIS — E039 Hypothyroidism, unspecified: Secondary | ICD-10-CM

## 2023-01-07 ENCOUNTER — Other Ambulatory Visit: Payer: Self-pay | Admitting: Family Medicine

## 2023-01-13 ENCOUNTER — Telehealth: Payer: Self-pay

## 2023-01-13 ENCOUNTER — Other Ambulatory Visit: Payer: Self-pay | Admitting: *Deleted

## 2023-01-13 MED ORDER — LANTUS SOLOSTAR 100 UNIT/ML ~~LOC~~ SOPN: PEN_INJECTOR | SUBCUTANEOUS | 2 refills | Status: DC

## 2023-01-13 NOTE — Telephone Encounter (Signed)
This refill was sent to local [facility] 01/07/23. I can send to [facility] instead-not requested prior.

## 2023-01-13 NOTE — Telephone Encounter (Signed)
Second refill request faxed for lantus solostar pen inj 3 mL

## 2023-01-20 ENCOUNTER — Other Ambulatory Visit: Payer: Self-pay

## 2023-01-20 MED ORDER — LANTUS SOLOSTAR 100 UNIT/ML ~~LOC~~ SOPN: PEN_INJECTOR | SUBCUTANEOUS | 2 refills | Status: DC

## 2023-02-06 ENCOUNTER — Other Ambulatory Visit: Payer: Medicare HMO

## 2023-02-06 DIAGNOSIS — E039 Hypothyroidism, unspecified: Secondary | ICD-10-CM

## 2023-02-06 DIAGNOSIS — Z5181 Encounter for therapeutic drug level monitoring: Secondary | ICD-10-CM

## 2023-02-07 ENCOUNTER — Other Ambulatory Visit: Payer: Self-pay | Admitting: Family Medicine

## 2023-02-07 LAB — TSH: TSH: 1.96 u[IU]/mL (ref 0.450–4.500)

## 2023-02-11 ENCOUNTER — Other Ambulatory Visit: Payer: Self-pay | Admitting: Family Medicine

## 2023-02-11 DIAGNOSIS — E039 Hypothyroidism, unspecified: Secondary | ICD-10-CM

## 2023-02-19 ENCOUNTER — Other Ambulatory Visit: Payer: Self-pay | Admitting: Family Medicine

## 2023-03-27 ENCOUNTER — Telehealth: Payer: Self-pay | Admitting: Family Medicine

## 2023-03-27 NOTE — Telephone Encounter (Signed)
 Johnathan Estrada

## 2023-05-27 ENCOUNTER — Telehealth: Payer: Self-pay | Admitting: Family Medicine

## 2023-05-27 ENCOUNTER — Other Ambulatory Visit: Payer: Self-pay | Admitting: Orthopaedic Surgery

## 2023-05-27 DIAGNOSIS — M25512 Pain in left shoulder: Secondary | ICD-10-CM | POA: Diagnosis not present

## 2023-05-27 DIAGNOSIS — M79645 Pain in left finger(s): Secondary | ICD-10-CM | POA: Diagnosis not present

## 2023-05-27 DIAGNOSIS — G8929 Other chronic pain: Secondary | ICD-10-CM

## 2023-05-27 NOTE — Telephone Encounter (Signed)
 Surgical clearance form received from [facility] today and sen back in folder

## 2023-05-28 NOTE — Telephone Encounter (Signed)
 He needs OV for clearance. I know he is scheduled for mid-April for his wellness visit. We can do some of the labs early (if he wants to come fasting), some will be too early (PSA last done 3/28), so will need labs drawn at physical. Won't need to come fasting for both, one or the other.

## 2023-05-28 NOTE — Telephone Encounter (Signed)
 Spoke to patient, he is still thinking about surgery and if he schedules before his April visit, he will call and make appointment

## 2023-06-17 ENCOUNTER — Other Ambulatory Visit: Payer: Self-pay | Admitting: Family Medicine

## 2023-06-17 DIAGNOSIS — E782 Mixed hyperlipidemia: Secondary | ICD-10-CM

## 2023-06-17 NOTE — Telephone Encounter (Signed)
 Copied from CRM [id number]. Topic: Clinical - Medication Refill >> Jun 17, 2023  4:19 PM Everette C wrote: Most Recent Primary Care Visit:  Provider: [facility]  Department: [facility]  Visit Type: LAB  Date: 02/06/2023  Medication: levothyroxine (SYNTHROID) 125 MCG tablet [[id number]]  rosuvastatin (CRESTOR) 10 MG tablet [[id number]]  insulin glargine (LANTUS SOLOSTAR) 100 UNIT/ML Solostar Pen [[id number]]  glucose blood (ACCU-CHEK GUIDE) test strip [[id number]]  Has the patient contacted their pharmacy? Yes (Agent: If no, request that the patient contact the pharmacy for the refill. If patient does not wish to contact the pharmacy document the reason why and proceed with request.) (Agent: If yes, when and what did the pharmacy advise?)  Is this the correct pharmacy for this prescription? Yes If no, delete pharmacy and type the correct one.  This is the patient's preferred pharmacy:  [id number] - [place] ([place] - [address] [place]) [place] [address] Phone: [phone] Fax: [phone]   Has the prescription been filled recently? Yes  Is the patient out of the medication? Yes  Has the patient been seen for an appointment in the last year OR does the patient have an upcoming appointment? Yes  Can we respond through MyChart? No  Agent: Please be advised that Rx refills may take up to 3 business days. We ask that you follow-up with your pharmacy.

## 2023-06-18 ENCOUNTER — Other Ambulatory Visit: Payer: Self-pay | Admitting: *Deleted

## 2023-06-18 DIAGNOSIS — E782 Mixed hyperlipidemia: Secondary | ICD-10-CM

## 2023-06-18 MED ORDER — ACCU-CHEK GUIDE TEST VI STRP: ORAL_STRIP | 2 refills | Status: DC

## 2023-06-18 MED ORDER — ROSUVASTATIN CALCIUM 10 MG PO TABS: ORAL_TABLET | ORAL | 0 refills | Status: DC

## 2023-06-18 MED ORDER — LANTUS SOLOSTAR 100 UNIT/ML ~~LOC~~ SOPN: PEN_INJECTOR | SUBCUTANEOUS | 0 refills | Status: DC

## 2023-06-18 MED ORDER — LEVOTHYROXINE SODIUM 125 MCG PO TABS: 125.0000 ug | ORAL_TABLET | Freq: Every day | ORAL | 0 refills | Status: DC

## 2023-06-20 ENCOUNTER — Other Ambulatory Visit: Payer: Self-pay | Admitting: Family Medicine

## 2023-07-02 ENCOUNTER — Other Ambulatory Visit: Payer: Self-pay | Admitting: Family Medicine

## 2023-07-06 NOTE — Progress Notes (Deleted)
 No chief complaint on file.  Patient presents for evaluation of painful red bump on right buttock cheek.    PMH, PSH, SH reviewed   ROS:    PHYSICAL EXAM:  There were no vitals taken for this visit.      ASSESSMENT/PLAN:   F/u as scheduled for med check later this week (4/16)

## 2023-07-07 ENCOUNTER — Ambulatory Visit: Admitting: Family Medicine
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
## 2023-07-08 NOTE — Patient Instructions (Incomplete)
 HEALTH MAINTENANCE RECOMMENDATIONS:  It is recommended that you get at least 30 minutes of aerobic exercise at least 5 days/week (for weight loss, you may need as much as 60-90 minutes). This can be any activity that gets your heart rate up. This can be divided in 10-15 minute intervals if needed, but try and build up your endurance at least once a week.  Weight bearing exercise is also recommended twice weekly.  Eat a healthy diet with lots of vegetables, fruits and fiber.  "Colorful" foods have a lot of vitamins (ie green vegetables, tomatoes, red peppers, etc).  Limit sweet tea, regular sodas and alcoholic beverages, all of which has a lot of calories and sugar.  Up to 2 alcoholic drinks daily may be beneficial for men (unless trying to lose weight, watch sugars).  Drink a lot of water.  Sunscreen of at least SPF 30 should be used on all sun-exposed parts of the skin when outside between the hours of 10 am and 4 pm (not just when at beach or pool, but even with exercise, golf, tennis, and yard work!)  Use a sunscreen that says "broad spectrum" so it covers both UVA and UVB rays, and make sure to reapply every 1-2 hours.  Remember to change the batteries in your smoke detectors when changing your clock times in the spring and fall.  Carbon monoxide detectors are recommended for your home.  Use your seat belt every time you are in a car, and please drive safely and not be distracted with cell phones and texting while driving.    Johnathan Estrada , Thank you for taking time to come for your Medicare Wellness Visit. I appreciate your ongoing commitment to your health goals. Please review the following plan we discussed and let me know if I can assist you in the future.   This is a list of the screening recommended for you and due dates:  Health Maintenance  Topic Date Due   Zoster (Shingles) Vaccine (1 of 2) Never done   COVID-19 Vaccine (1 - 2024-25 season) Never done   Yearly kidney function blood  test for diabetes  06/20/2023   Yearly kidney health urinalysis for diabetes  06/20/2023   Hemoglobin A1C  06/25/2023   Eye exam for diabetics  09/27/2023   Flu Shot  10/24/2023   Complete foot exam   12/25/2023   Medicare Annual Wellness Visit  07/08/2024   DTaP/Tdap/Td vaccine (5 - Td or Tdap) 10/24/2025   Colon Cancer Screening  08/09/2031   Pneumonia Vaccine  Completed   Hepatitis C Screening  Completed   HPV Vaccine  Aged Out   Meningitis B Vaccine  Aged Out   I recommend getting the new shingles vaccine (Shingrix). Since you have Medicare, you will need to get this from the pharmacy, as it is covered by Part D. This is a series of 2 injections, spaced 2 months apart.   This should be separated from other vaccines by at least 2 weeks.  RSV vaccine is recommended.  The season is now over, so you can wait to get this in the Fall.  You can get this the same day as your flu shot, or you can separate them by 2 weeks.  Your diabetes puts you at higher risk for a complication from this viral illness, so this vaccine is recommended.  You need to get this from the pharmacy (due to your insurance).  Please bring [place] copies of your Living Will and Healthcare  Power of Attorney once completed and notarized so that it can be scanned into your medical chart.

## 2023-07-09 ENCOUNTER — Encounter: Payer: Self-pay | Admitting: Family Medicine

## 2023-07-09 ENCOUNTER — Ambulatory Visit (INDEPENDENT_AMBULATORY_CARE_PROVIDER_SITE_OTHER): Payer: Medicare HMO | Admitting: Family Medicine

## 2023-07-09 VITALS — BP 124/80 | HR 61 | Ht 69.5 in | Wt 194.4 lb

## 2023-07-09 DIAGNOSIS — E1169 Type 2 diabetes mellitus with other specified complication: Secondary | ICD-10-CM | POA: Diagnosis not present

## 2023-07-09 DIAGNOSIS — E559 Vitamin D deficiency, unspecified: Secondary | ICD-10-CM | POA: Diagnosis not present

## 2023-07-09 DIAGNOSIS — E782 Mixed hyperlipidemia: Secondary | ICD-10-CM

## 2023-07-09 DIAGNOSIS — E039 Hypothyroidism, unspecified: Secondary | ICD-10-CM

## 2023-07-09 DIAGNOSIS — Z0000 Encounter for general adult medical examination without abnormal findings: Secondary | ICD-10-CM

## 2023-07-09 DIAGNOSIS — Z125 Encounter for screening for malignant neoplasm of prostate: Secondary | ICD-10-CM

## 2023-07-09 DIAGNOSIS — G478 Other sleep disorders: Secondary | ICD-10-CM

## 2023-07-09 DIAGNOSIS — Z794 Long term (current) use of insulin: Secondary | ICD-10-CM

## 2023-07-09 DIAGNOSIS — Z113 Encounter for screening for infections with a predominantly sexual mode of transmission: Secondary | ICD-10-CM

## 2023-07-09 DIAGNOSIS — Z5181 Encounter for therapeutic drug level monitoring: Secondary | ICD-10-CM

## 2023-07-09 DIAGNOSIS — E113299 Type 2 diabetes mellitus with mild nonproliferative diabetic retinopathy without macular edema, unspecified eye: Secondary | ICD-10-CM

## 2023-07-09 DIAGNOSIS — Z7251 High risk heterosexual behavior: Secondary | ICD-10-CM | POA: Diagnosis not present

## 2023-07-09 LAB — LIPID PANEL

## 2023-07-10 ENCOUNTER — Encounter: Payer: Self-pay | Admitting: Family Medicine

## 2023-07-10 LAB — CMP14+EGFR
ALT: 15 IU/L (ref 0–44)
AST: 22 IU/L (ref 0–40)
Albumin: 4.8 g/dL (ref 3.9–4.9)
Alkaline Phosphatase: 65 IU/L (ref 44–121)
BUN/Creatinine Ratio: 13 (ref 10–24)
BUN: 14 mg/dL (ref 8–27)
Bilirubin Total: 0.7 mg/dL (ref 0.0–1.2)
CO2: 19 mmol/L — ABNORMAL LOW (ref 20–29)
Calcium: 9.3 mg/dL (ref 8.6–10.2)
Chloride: 105 mmol/L (ref 96–106)
Creatinine, Ser: 1.06 mg/dL (ref 0.76–1.27)
Globulin, Total: 2.4 g/dL (ref 1.5–4.5)
Glucose: 106 mg/dL — ABNORMAL HIGH (ref 70–99)
Potassium: 4.6 mmol/L (ref 3.5–5.2)
Sodium: 140 mmol/L (ref 134–144)
Total Protein: 7.2 g/dL (ref 6.0–8.5)
eGFR: 77 mL/min/{1.73_m2} (ref >59)

## 2023-07-10 LAB — HEPATITIS B SURFACE ANTIGEN: Hepatitis B Surface Ag: NEGATIVE

## 2023-07-10 LAB — PSA: Prostate Specific Ag, Serum: 2.8 ng/mL (ref 0.0–4.0)

## 2023-07-10 LAB — LIPID PANEL
Cholesterol, Total: 139 mg/dL (ref 100–199)
HDL: 35 mg/dL — ABNORMAL LOW (ref >39)
LDL CALC COMMENT:: 4 ratio (ref 0.0–5.0)
LDL Chol Calc (NIH): 78 mg/dL (ref 0–99)
Triglycerides: 148 mg/dL (ref 0–149)
VLDL Cholesterol Cal: 26 mg/dL (ref 5–40)

## 2023-07-10 LAB — MICROALBUMIN / CREATININE URINE RATIO
Creatinine, Urine: 128.7 mg/dL
Microalb/Creat Ratio: 11 mg/g{creat} (ref 0–29)
Microalbumin, Urine: 13.8 ug/mL

## 2023-07-10 LAB — TSH: TSH: 4.51 u[IU]/mL — ABNORMAL HIGH (ref 0.450–4.500)

## 2023-07-10 LAB — HEPATITIS C ANTIBODY: Hep C Virus Ab: NONREACTIVE

## 2023-07-10 LAB — HIV ANTIBODY (ROUTINE TESTING W REFLEX): HIV Screen 4th Generation wRfx: NONREACTIVE

## 2023-07-10 LAB — HEMOGLOBIN A1C
Est. average glucose Bld gHb Est-mCnc: 126 mg/dL
Hgb A1c MFr Bld: 6 % — ABNORMAL HIGH (ref 4.8–5.6)

## 2023-07-10 LAB — RPR: RPR Ser Ql: NONREACTIVE

## 2023-07-10 LAB — VITAMIN D 25 HYDROXY (VIT D DEFICIENCY, FRACTURES): Vit D, 25-Hydroxy: 41.7 ng/mL (ref 30.0–100.0)

## 2023-07-10 MED ORDER — ROSUVASTATIN CALCIUM 10 MG PO TABS: ORAL_TABLET | ORAL | 3 refills | Status: DC

## 2023-07-10 NOTE — Progress Notes (Signed)
 Pt was supposed to set up a med check when he left (for 1-2 weeks, and also his 6 month med check). He didn't schedule a visit. His TSH was a little off--I need to know if he needs a refill before his appointment, or if he has enough. I had sent a message to [facility] asking to schedule him for next Wednesday. That hasn't been done yet. So, please call him and schedule him for Wednesday (if that works for him), and ask if he has enough synthroid to last until we discuss his results. Thanks

## 2023-07-11 LAB — GC/CHLAMYDIA PROBE AMP
Chlamydia trachomatis, NAA: NEGATIVE
Neisseria Gonorrhoeae by PCR: NEGATIVE

## 2023-07-15 NOTE — Progress Notes (Unsigned)
 No chief complaint on file.  Patient presents for follow-up on chronic medical problems, and labs done last week. He had his physical last week. At that time he noted that he had to cut back on walking for 6 weeks (due to getting attacked by a dog), but was working his way back up. He had gained some weight related to being less active.  Also had reported some dietary indiscretion--had gone back to eating chips, but has already corrected that.  Hypothyroidism:  He reports compliance in taking levothyroxine  125 mcg daily.  He noted last week that he had missed 3-4 doses of his thyroid  medication (had misplaced the bottle). No further missed doses.  His dose was decreased from 137 mcg in 12/2022, and recheck in November was at goal. He takes this medication on an empty stomach, takes it only with the statin, no other medications or vitamins. No changes to hair/skin/nails/energy.  Some weight gain as above. No changes to moods.  Lab Results  Component Value Date   TSH 4.510 (H) 07/09/2023    Vitamin D  deficiency:  Last level was 78.3 in 05/2022 when taking 5000 IU of D3 daily, in addition to MVI daily.  At his October visit he hadn't been taking any D or MVI for 1-2 months, had forgotten. He was reminded to resume these vitamins, but admitted last week that he hadn't been taking any MVI or D3.  Vitamin D  level last week was normal at 41.7.  Unrefreshed by sleep - with some witnessed apnea by former girlfriend. Given phone number for [employer] to set up sleep study     PMH, PSH, SH reviewed   ROS:    PHYSICAL EXAM:  There were no vitals taken for this visit.    Labs:  Lab Results  Component Value Date   TSH 4.510 (H) 07/09/2023   Lab Results  Component Value Date   HGBA1C 6.0 (H) 07/09/2023     Chemistry      Component Value Date/Time   NA 140 07/09/2023 1120   K 4.6 07/09/2023 1120   CL 105 07/09/2023 1120   CO2 19 (L) 07/09/2023 1120   BUN 14 07/09/2023 1120   CREATININE  1.06 07/09/2023 1120   CREATININE 1.19 06/14/2015 1222      Component Value Date/Time   CALCIUM  9.3 07/09/2023 1120   ALKPHOS 65 07/09/2023 1120   AST 22 07/09/2023 1120   ALT 15 07/09/2023 1120   BILITOT 0.7 07/09/2023 1120     Fasting glucose 106 Microalbumin/Cr ratio 11  Lab Results  Component Value Date   CHOL 139 07/09/2023   HDL 35 (L) 07/09/2023   LDLCALC 78 07/09/2023   TRIG 148 07/09/2023   CHOLHDL 4.0 07/09/2023    Lab Results  Component Value Date   WBC 5.9 06/20/2022   HGB 14.9 06/20/2022   HCT 44.1 06/20/2022   MCV 87 06/20/2022   PLT 264 06/20/2022   Lab Results  Component Value Date   PSA1 2.8 07/09/2023   PSA1 3.7 06/20/2022   PSA1 2.8 09/19/2021   PSA 1.11 01/13/2014   PSA 1.36 01/25/2013   PSA 1.32 06/06/2011   Vitamin D -OH 41.7 Normal STD testing--HIV, RPR, GC/chlamydia, Hep B and C testing   ASSESSMENT/PLAN:   Start D3 2000 IU (vs 1000 IU and MVI) Cont levothyroxine  125 mcg. Recheck TSH at 6 mo f/u visit.  F/u as scheduled in 12/2023.   Your vitamin D  level was suprisingly normal.  I expected it  to be lower due to not taking multivitamin or D3 for quite some time.  I expect that it will continue to drop if you don't take any supplements (based on prior history of low vitamin D  when not taking these vitamins). I recommend taking vitamin D3 2000 IU daily (or you could take 1000 IU plus a separate multivitamin containing D3).  Continue the levothyroxine  at the same dose of 125 mcg daily.  Your TSH was slightly high, likely reflecting the few missed pills the week prior. I suspect this is still the correct dose.  We will recheck at your visit in 6 months.

## 2023-07-16 ENCOUNTER — Ambulatory Visit (INDEPENDENT_AMBULATORY_CARE_PROVIDER_SITE_OTHER): Admitting: Family Medicine

## 2023-07-16 ENCOUNTER — Encounter: Payer: Self-pay | Admitting: Family Medicine

## 2023-07-16 VITALS — BP 132/74 | HR 76 | Ht 69.5 in | Wt 196.0 lb

## 2023-07-16 DIAGNOSIS — E1169 Type 2 diabetes mellitus with other specified complication: Secondary | ICD-10-CM

## 2023-07-16 DIAGNOSIS — Z794 Long term (current) use of insulin: Secondary | ICD-10-CM | POA: Diagnosis not present

## 2023-07-16 DIAGNOSIS — E782 Mixed hyperlipidemia: Secondary | ICD-10-CM

## 2023-07-16 DIAGNOSIS — E559 Vitamin D deficiency, unspecified: Secondary | ICD-10-CM | POA: Diagnosis not present

## 2023-07-16 DIAGNOSIS — F341 Dysthymic disorder: Secondary | ICD-10-CM

## 2023-07-16 DIAGNOSIS — E039 Hypothyroidism, unspecified: Secondary | ICD-10-CM

## 2023-07-16 MED ORDER — LEVOTHYROXINE SODIUM 125 MCG PO TABS: 125.0000 ug | ORAL_TABLET | Freq: Every day | ORAL | 1 refills | Status: DC

## 2023-07-16 NOTE — Patient Instructions (Addendum)
  Your vitamin D  level was suprisingly normal.  I expected it to be lower due to not taking multivitamin or D3 for quite some time.  I expect that it will continue to drop if you don't take any supplements (based on prior history of low vitamin D  when not taking these vitamins). I recommend taking vitamin D3 2000 IU daily (or you could take 1000 IU plus a separate multivitamin containing D3).  Continue the levothyroxine  at the same dose of 125 mcg daily.  Your TSH was slightly high, likely reflecting the few missed pills the week prior. I suspect this is still the correct dose.  We will recheck at your visit in 6 months.  Continue to monitor your sugars. If you clean up your diet and your sugars start to drop, remember that consistent sugars of <80 (fasting), you can cut the dose back by 2 units.  Contact us  with your blood sugars if you have any questions about them or your insulin  dose.  Staying more active (as you have just recently been able to restart), and being more careful with your diet (eliminating chips--both potato and tortilla) will help with your weight. Goal is to lose inches from the waist. Try and eat more fruits, vegetables, and keep portions of meat to 1/4 of your plate. Limit carbs, trying to eat more whole grains.  Consider counseling to help with your moods and self image. You can check with your insurance for providers in town. You can contact [facility] and [facility], as these are [facility] practices, and likely take your insurance.

## 2023-07-30 ENCOUNTER — Other Ambulatory Visit: Payer: Self-pay | Admitting: Family Medicine

## 2023-07-30 MED ORDER — ACCU-CHEK GUIDE TEST VI STRP: ORAL_STRIP | 2 refills | Status: DC

## 2023-07-30 NOTE — Telephone Encounter (Signed)
 Copied from CRM [id number]. Topic: Clinical - Medication Refill >> Jul 30, 2023  3:08 PM Everette C wrote: Medication: glucose blood (ACCU-CHEK GUIDE) test strip [[id number]]  Has the patient contacted their pharmacy? Yes (Agent: If no, request that the patient contact the pharmacy for the refill. If patient does not wish to contact the pharmacy document the reason why and proceed with request.) (Agent: If yes, when and what did the pharmacy advise?)  This is the patient's preferred pharmacy:  [facility] [address], [place] - [address] [place] [address] Phone: [phone] Fax: [phone]  Is this the correct pharmacy for this prescription? Yes If no, delete pharmacy and type the correct one.   Has the prescription been filled recently? Yes  Is the patient out of the medication? Yes  Has the patient been seen for an appointment in the last year OR does the patient have an upcoming appointment? Yes  Can we respond through MyChart? No  Agent: Please be advised that Rx refills may take up to 3 business days. We ask that you follow-up with your pharmacy.

## 2023-07-30 NOTE — Telephone Encounter (Signed)
 Last Fill: 07/01/22  Last OV: 07/16/23 Next OV: 01/12/24  Routing to provider for review/authorization.

## 2023-08-05 ENCOUNTER — Other Ambulatory Visit: Payer: Self-pay | Admitting: Family Medicine

## 2023-08-05 DIAGNOSIS — E039 Hypothyroidism, unspecified: Secondary | ICD-10-CM

## 2023-08-05 NOTE — Telephone Encounter (Signed)
 Copied from CRM [id number]. Topic: Clinical - Medication Refill >> Aug 05, 2023  9:05 AM Alpha Arts wrote: Medication: levothyroxine  (SYNTHROID ) 125 MCG tablet   Has the patient contacted their pharmacy? Yes (Agent: If no, request that the patient contact the pharmacy for the refill. If patient does not wish to contact the pharmacy document the reason why and proceed with request.) (Agent: If yes, when and what did the pharmacy advise?)  This is the patient's preferred pharmacy:   [facility] - [place] ([place]), [place] - [address]  ([place]) [place] [address] Phone: [phone] Fax: [phone]  Is this the correct pharmacy for this prescription? Yes If no, delete pharmacy and type the correct one.   Has the prescription been filled recently? Yes  Is the patient out of the medication? Yes  Has the patient been seen for an appointment in the last year OR does the patient have an upcoming appointment? Yes  Can we respond through MyChart? Yes  Agent: Please be advised that Rx refills may take up to 3 business days. We ask that you follow-up with your pharmacy.

## 2023-09-04 ENCOUNTER — Other Ambulatory Visit: Payer: Self-pay | Admitting: Family Medicine

## 2023-09-04 DIAGNOSIS — E782 Mixed hyperlipidemia: Secondary | ICD-10-CM

## 2023-09-05 ENCOUNTER — Telehealth: Payer: Self-pay

## 2023-09-05 ENCOUNTER — Other Ambulatory Visit: Payer: Self-pay | Admitting: Family Medicine

## 2023-09-05 ENCOUNTER — Other Ambulatory Visit: Payer: Self-pay

## 2023-09-05 DIAGNOSIS — E782 Mixed hyperlipidemia: Secondary | ICD-10-CM

## 2023-09-05 MED ORDER — INSULIN PEN NEEDLE 32G X 4 MM MISC: 1.0000 | Freq: Two times a day (BID) | 0 refills | Status: AC

## 2023-09-05 MED ORDER — ROSUVASTATIN CALCIUM 10 MG PO TABS: ORAL_TABLET | ORAL | 2 refills | Status: AC

## 2023-09-05 NOTE — Telephone Encounter (Signed)
 Copied from CRM [id number]. Topic: Clinical - Medication Refill >> Sep 05, 2023  3:01 PM Yolanda T wrote: Medication: Insulin  Pen Needle 32G X 4 MM MISC  Has the patient contacted their pharmacy? No (Agent: If no, request that the patient contact the pharmacy for the refill. If patient does not wish to contact the pharmacy document the reason why and proceed with request.) (Agent: If yes, when and what did the pharmacy advise?)  This is the patient's preferred pharmacy:  [facility] - [place] ([place]), [place] - [address] [place] ([place]) [place] [address] Phone: [phone] Fax: [phone]  Is this the correct pharmacy for this prescription? Yes  Has the prescription been filled recently? Yes  Is the patient out of the medication? No  Has the patient been seen for an appointment in the last year OR does the patient have an upcoming appointment? Yes  Can we respond through MyChart? No  Agent: Please be advised that Rx refills may take up to 3 business days. We ask that you follow-up with your pharmacy.

## 2023-09-05 NOTE — Telephone Encounter (Signed)
 I called the pt. Back and he needed his Crestor  for a 90 day supply I explained that it was sent in for a 90 day supply. He said it needed to go to a Surveyor, quantity pharmacy so I did [facility] [address].   Copied from CRM [id number]. Topic: Clinical - Medication Question >> Sep 05, 2023  2:55 PM Yolanda T wrote: Reason for CRM: patient called said he was only given a 30 day supply for his rosuvastatin  (CRESTOR ) 10 MG tablet. Patient is requesting his 90 day supply that he normally get at a lesser amount through his insurance. Please f/u with patient

## 2023-10-14 NOTE — Progress Notes (Signed)
   10/14/2023  Patient ID: Johnathan Estrada, male   DOB: [birth year], 68 y.o.   MRN: [id number]  Pharmacy Quality Measure Review  This patient is appearing on a report for being at risk of failing the adherence measure for cholesterol (statin) medications this calendar year.   Medication: Rosuvastatin  Last fill date: 10/01/23 for 90 day supply  Insurance report was not up to date. No action needed at this time.   Jon VEAR Lindau, PharmD Clinical Pharmacist [phone]

## 2023-10-16 DIAGNOSIS — R200 Anesthesia of skin: Secondary | ICD-10-CM | POA: Diagnosis not present

## 2023-10-16 DIAGNOSIS — M65332 Trigger finger, left middle finger: Secondary | ICD-10-CM | POA: Diagnosis not present

## 2023-10-16 DIAGNOSIS — M20012 Mallet finger of left finger(s): Secondary | ICD-10-CM | POA: Diagnosis not present

## 2023-10-16 DIAGNOSIS — M65322 Trigger finger, left index finger: Secondary | ICD-10-CM | POA: Diagnosis not present

## 2023-10-16 DIAGNOSIS — R202 Paresthesia of skin: Secondary | ICD-10-CM | POA: Diagnosis not present

## 2023-10-16 DIAGNOSIS — M65312 Trigger thumb, left thumb: Secondary | ICD-10-CM | POA: Diagnosis not present

## 2023-10-28 NOTE — Procedures (Signed)
 Johnathan Estrada

## 2023-10-28 NOTE — Procedures (Signed)
 SABRA

## 2023-10-29 ENCOUNTER — Encounter (HOSPITAL_BASED_OUTPATIENT_CLINIC_OR_DEPARTMENT_OTHER): Payer: Self-pay | Admitting: Emergency Medicine

## 2023-10-29 ENCOUNTER — Emergency Department (HOSPITAL_BASED_OUTPATIENT_CLINIC_OR_DEPARTMENT_OTHER)
Admission: EM | Admit: 2023-10-29 | Discharge: 2023-10-30 | Disposition: A | Discharge status: Home / Self Care | Attending: Emergency Medicine | Admitting: Emergency Medicine

## 2023-10-29 ENCOUNTER — Other Ambulatory Visit: Payer: Self-pay

## 2023-10-29 DIAGNOSIS — R300 Dysuria: Secondary | ICD-10-CM | POA: Diagnosis not present

## 2023-10-29 DIAGNOSIS — E119 Type 2 diabetes mellitus without complications: Secondary | ICD-10-CM | POA: Insufficient documentation

## 2023-10-29 NOTE — ED Provider Notes (Signed)
 [facility] Emergency Department Provider Note MRN:  [id number]  Arrival date & time: 10/30/23     Chief Complaint   Dysuria   History of Present Illness   Johnathan Estrada is a 68 y.o. year-old male with a history of diabetes presenting to the ED with chief complaint of dysuria.  Pain with urination and 5 PM.  Pain again with urination at 8:30 PM.  Pain with urination here in the emergency department at 11 PM, not as bad as the first 2 times.  Denies fever, no flank pain, no abdominal pain, not sexually active.  Review of Systems  A thorough review of systems was obtained and all systems are negative except as noted in the HPI and PMH.   Patient's Health History    Past Medical History:  Diagnosis Date   Allergic rhinitis, cause unspecified    s/p immunotherapy   Depression    sees Dr. Vincente   Diabetes [facility]) 05/2015   Diverticulosis 7/09   Head injury 3/08   with facial fracture   Internal hemorrhoid 7/09   Male hypogonadism 2013   low testosterone    Unspecified hypothyroidism     Past Surgical History:  Procedure Laterality Date   CARPAL TUNNEL RELEASE Right    right   COLONOSCOPY  10/07/2007   Dr. Dianna; diverticulosis and small internal hemorrhoids; repeat 10 yrs   HERNIA REPAIR     INGUINAL HERNIA REPAIR  03/26/1999   Dr. Ethyl FONTANA HERNIA REPAIR Right 03/11/2017   Procedure: HERNIA REPAIR INGUINAL ERAS PATWAY;  Surgeon: Gail Favorite, MD;  Location: [facility];  Service: General;  Laterality: Right;   INSERTION OF MESH Right 03/11/2017   Procedure: INSERTION OF MESH;  Surgeon: Gail Favorite, MD;  Location: [facility];  Service: General;  Laterality: Right;   KNEE SURGERY Right 10/23/2005   right, arthroscopic; Dr. Jane   ORIF FINGER FRACTURE Right    R 3rd and 4th fingers   SHOULDER SURGERY     bilateral (10/05 L; 8/07 R)    Family History  Problem Relation Age of Onset   Diabetes Mother         borderline   Heart disease Mother        irregular heartbeat   COPD Mother    Dementia Mother    Diabetes Brother    Stroke Maternal Grandmother    Kidney disease Maternal Grandfather    Heart disease Maternal Aunt    Cancer Maternal Uncle        colon cancer   Heart disease Maternal Uncle     Social History   Socioeconomic History   Marital status: Single    Spouse name: Not on file   Number of children: Not on file   Years of education: Not on file   Highest education level: Not on file  Occupational History   Occupation: Curator for Engineer, drilling: [employer]  Tobacco Use   Smoking status: Never   Smokeless tobacco: Never  Vaping Use   Vaping status: Never Used  Substance and Sexual Activity   Alcohol use: No   Drug use: Not Currently    Types: Marijuana    Comment: no marijuana since 1986   Sexual activity: Not Currently    Birth control/protection: None  Other Topics Concern   Not on file  Social History Narrative   Moved in with his mother when his father got sick.  He passed away in 2013.  Mother passed away Feb 21, 2018.    Still living in his mother's house (still owns his own house). These are on the same tract of land.   Lives alone, with 2 dogs.         Updated 06/2023   Social Drivers of Health   Financial Resource Strain: Low Risk  (07/09/2023)   Overall Financial Resource Strain (CARDIA)    Difficulty of Paying Living Expenses: Not hard at all  Food Insecurity: No Food Insecurity (07/09/2023)   Hunger Vital Sign    Worried About Running Out of Food in the Last Year: Never true    Ran Out of Food in the Last Year: Never true  Transportation Needs: No Transportation Needs (07/09/2023)   PRAPARE - Administrator, Civil Service (Medical): No    Lack of Transportation (Non-Medical): No  Physical Activity: Sufficiently Active (07/09/2023)   Exercise Vital Sign    Days of Exercise per Week: 7 days    Minutes of Exercise per  Session: 150+ min  Stress: No Stress Concern Present (07/09/2023)   [employer] of Occupational Health - Occupational Stress Questionnaire    Feeling of Stress : Not at all  Social Connections: Socially Isolated (07/09/2023)   Social Connection and Isolation Panel    Frequency of Communication with Friends and Family: Twice a week    Frequency of Social Gatherings with Friends and Family: Never    Attends Religious Services: Never    Database administrator or Organizations: No    Attends Banker Meetings: Never    Marital Status: Never married  Intimate Partner Violence: Not At Risk (07/09/2023)   Humiliation, Afraid, Rape, and Kick questionnaire    Fear of Current or Ex-Partner: No    Emotionally Abused: No    Physically Abused: No    Sexually Abused: No     Physical Exam   Vitals:   10/29/23 2300  BP: (!) 144/92  Pulse: 66  Resp: 17  Temp: 97.6 F (36.4 C)  SpO2: 97%    CONSTITUTIONAL: Well-appearing, NAD NEURO/PSYCH:  Alert and oriented x 3, no focal deficits EYES:  eyes equal and reactive ENT/NECK:  no LAD, no JVD CARDIO: Regular rate, well-perfused, normal S1 and S2 PULM:  CTAB no wheezing or rhonchi GI/GU:  non-distended, non-tender MSK/SPINE:  No gross deformities, no edema SKIN:  no rash, atraumatic   *Additional and/or pertinent findings included in MDM below  Diagnostic and Interventional Summary    EKG Interpretation Date/Time:    Ventricular Rate:    PR Interval:    QRS Duration:    QT Interval:    QTC Calculation:   R Axis:      Text Interpretation:         Labs Reviewed  URINALYSIS, ROUTINE W REFLEX MICROSCOPIC - Abnormal; Notable for the following components:      Result Value   Color, Urine COLORLESS (*)    Specific Gravity, Urine <1.005 (*)    Hgb urine dipstick SMALL (*)    All other components within normal limits    No orders to display    Medications - No data to display   Procedures  /  Critical  Care Procedures  ED Course and Medical Decision Making  Initial Impression and Ddx Dysuria this evening, no straining, patient does not have concern for STDs, has not been sexually active over the past 5 or 6 years.  Suspect UTI.  No systemic symptoms, vitals reassuring, abdomen soft and nontender.  Past medical/surgical history that increases complexity of ED encounter: Diabetes, well-controlled  Interpretation of Diagnostics I personally reviewed the Laboratory Testing and my interpretation is as follows:    Urinalysis is unremarkable  Patient Reassessment and Ultimate Disposition/Management     Discharge  Patient management required discussion with the following services or consulting groups:  None  Complexity of Problems Addressed Acute complicated illness or Injury  Additional Data Reviewed and Analyzed Further history obtained from: None  Additional Factors Impacting ED Encounter Risk None  Ozell HERO. Theadore, MD [facility] Emergency Medicine [facility] [email or web address]  Final Clinical Impressions(s) / ED Diagnoses     ICD-10-CM   1. Dysuria  R30.0       ED Discharge Orders     None        Discharge Instructions Discussed with and Provided to Patient:     Discharge Instructions      You were evaluated in the Emergency Department and after careful evaluation, we did not find any emergent condition requiring admission or further testing in the hospital.  Your exam/testing today is overall reassuring.  Your urine sample was normal, no signs of infection.  Recommend closely monitoring your symptoms and following up with your primary care doctor if symptoms continue.  Please return to the Emergency Department if you experience any worsening of your condition.   Thank you for allowing us  to be a part of your care.       Theadore Ozell HERO, MD 10/30/23 [phone]

## 2023-10-29 NOTE — ED Triage Notes (Signed)
 Patient coming to ED for evaluation of dysuria.  Reports symptoms started around 5 PM today.  No hx of same.  Pain only with urination.  No reports of blood noted in urine.  No fevers, nausea, or vomiting

## 2023-10-30 ENCOUNTER — Ambulatory Visit: Admitting: Family Medicine

## 2023-10-30 ENCOUNTER — Encounter: Payer: Self-pay | Admitting: Family Medicine

## 2023-10-30 ENCOUNTER — Ambulatory Visit: Payer: Self-pay

## 2023-10-30 VITALS — BP 132/80 | HR 72 | Temp 98.7°F | Ht 69.5 in | Wt 197.0 lb

## 2023-10-30 DIAGNOSIS — R309 Painful micturition, unspecified: Secondary | ICD-10-CM | POA: Diagnosis not present

## 2023-10-30 LAB — URINALYSIS, ROUTINE W REFLEX MICROSCOPIC
Bacteria, UA: NONE SEEN
Bilirubin Urine: NEGATIVE
Glucose, UA: NEGATIVE mg/dL
Ketones, ur: NEGATIVE mg/dL
Leukocytes,Ua: NEGATIVE
Nitrite: NEGATIVE
Protein, ur: NEGATIVE mg/dL
Specific Gravity, Urine: <1.005 — ABNORMAL LOW (ref 1.005–1.030)
pH: 5.5 (ref 5.0–8.0)

## 2023-10-30 LAB — POCT URINALYSIS DIP (PROADVANTAGE DEVICE)
Bilirubin, UA: NEGATIVE
Blood, UA: NEGATIVE
Glucose, UA: 100 mg/dL — AB
Ketones, POC UA: NEGATIVE mg/dL
Nitrite, UA: NEGATIVE
Protein Ur, POC: NEGATIVE mg/dL
Specific Gravity, Urine: 1.01
Urobilinogen, Ur: 0.2
pH, UA: 7 (ref 5.0–8.0)

## 2023-10-30 NOTE — Telephone Encounter (Signed)
 Scheduled patient to see Dr. Randol at 1:45pm.

## 2023-10-30 NOTE — Discharge Instructions (Signed)
 You were evaluated in the Emergency Department and after careful evaluation, we did not find any emergent condition requiring admission or further testing in the hospital.  Your exam/testing today is overall reassuring.  Your urine sample was normal, no signs of infection.  Recommend closely monitoring your symptoms and following up with your primary care doctor if symptoms continue.  Please return to the Emergency Department if you experience any worsening of your condition.   Thank you for allowing us  to be a part of your care.

## 2023-10-30 NOTE — Patient Instructions (Signed)
 Stay well hydrated--drink more water and less caffeinated beverages.  Your urine doesn't appear to be infected (based on the urine from last night and that your symptoms are improving). We discussed that a small/tiny bladder stone leaving the bladder could cause similar symptoms. Inflammation and infection of the urethra can cause these symptoms--you are not at risk for sexually transmitted diseases.  If you have recurrent or worsening pain with urination, trouble emptying your bladder, blood in the urine, fever, flank pain, then you will need to be re-evaluated.

## 2023-10-30 NOTE — Telephone Encounter (Signed)
 FYI Only or Action Required?: Action required by provider: clinical question for provider and update on patient condition.  Patient was last seen in primary care on 07/16/2023 by Randol Dawes, MD.  Called Nurse Triage reporting urinary symptoms.  Symptoms began yesterday.  Interventions attempted: Other: ed evaluation.  Symptoms are: gradually improving.  Triage Disposition: See PCP Within 2 Weeks  Patient/caregiver understands and will follow disposition?: Unsure  Copied from CRM [id number]. Topic: Clinical - Red Word Triage >> Oct 30, 2023 10:13 AM Donna BRAVO wrote: Red Word that prompted transfer to Nurse Triage: patient was in the ED burning when urinating in penis only, still having pain, not as bad as it was. Reason for Disposition  All other urine symptoms  Answer Assessment - Initial Assessment Questions 1. SYMPTOM: What's the main symptom you're concerned about? (e.g., frequency, incontinence)     Burning on urination 2. ONSET: When did the  dysuria  start?     10/29/23 3. PAIN: Is there any pain? If Yes, ask: How bad is it? (Scale: 1-10; mild, moderate, severe)     Moderate but improving 4. CAUSE: What do you think is causing the symptoms?     unsure 5. OTHER SYMPTOMS: Do you have any other symptoms? (e.g., blood in urine, fever, flank pain, pain with urination)     None  Additional info: On 10/29/23 around 5pm he voided and felt intense burning, he decided to proceed to emergency room since pain was so intense, once at the ER he provided urine sample which he states was still painful but not as intense as the previous void, uti was ruled out at ED. Since returning home he is still experiencing burning with urination but each void is less intense pain. He wanted to let Dr. Randol know that he went to ER, he would like her recommendation if he should schedule a follow up appointment or if ok to wait and call back if symptoms do not improve. Please advise re: if you would  like hospital follow up appointment scheduled.  Protocols used: Urinary Symptoms-A-AH

## 2023-10-30 NOTE — Progress Notes (Signed)
 Chief Complaint  Patient presents with   Penis Pain    Yesterday around 5pm he urinated and had extreme pain in his penis, especially at the head. Again at 8pm went to ER after that and was seen. Not given any meds. Pain is less today. When he just went it didn't really hurt much.    He went to the ER last night.  He had painful urination at 5pm and 8:30 pm. Pain was isolated to the penis, near the front.  No abdominal pain. When he voided 11pm at ER, had pain, but was less than prior episodes.  Urine last night showed small hemoglobin, SG <1.005. microscopic evaluation was normal.  Dysuria persists today, but is improving. When he gave sample in the office today, he didn't really have to go, he had no discomfort.  Not sexually active since 2020. No known trauma, injury.  No prior h/o kidney stones.   PMH, PSH, SH reviewed  Outpatient Encounter Medications as of 10/30/2023  Medication Sig Note   glucose blood (ACCU-CHEK GUIDE TEST) test strip Use as instructed    glucose blood (ACCU-CHEK GUIDE) test strip TEST 2 TIMES A DAY AS DIRECTED    insulin  glargine (LANTUS  SOLOSTAR) 100 UNIT/ML Solostar Pen ADMINISTER 18 TO 21 UNITS UNDER THE SKIN TO THE SKIN DAILY    Insulin  Pen Needle 32G X 4 MM MISC 1 each by Does not apply route 2 (two) times daily.    Lancets (ONETOUCH ULTRASOFT) lancets Use as instructed    levothyroxine  (SYNTHROID ) 125 MCG tablet TAKE 1 TABLET BY MOUTH ONCE DAILY BEFORE BREAKFAST    rosuvastatin  (CRESTOR ) 10 MG tablet TAKE 1 TABLET(10 MG) BY MOUTH DAILY    loratadine-pseudoephedrine (CLARITIN-D 24-HOUR) 10-240 MG 24 hr tablet Take 1 tablet by mouth daily. (Patient not taking: Reported on 10/30/2023) 10/30/2023: Taking as needed   Multiple Vitamins-Minerals (CENTRUM SILVER PO) Take 1 tablet by mouth daily. (Patient not taking: Reported on 10/30/2023)    VITAMIN D  PO Take by mouth. (Patient not taking: Reported on 10/30/2023) 07/16/2023: Has not taken in at least 6 months   No  facility-administered encounter medications on file as of 10/30/2023.    ROS: No f/c, no back pain, flank pain, no n/v/d. No hematuria. Dysuria is improving Denies penile discharge   PHYSICAL EXAM:  BP 132/80   Pulse 72   Temp 98.7 F (37.1 C) (Tympanic)   Ht 5' 9.5 (1.765 m)   Wt 197 lb (89.4 kg)   BMI 28.67 kg/m   Wt Readings from Last 3 Encounters:  10/30/23 197 lb (89.4 kg)  10/29/23 190 lb (86.2 kg)  07/16/23 196 lb (88.9 kg)   Pleasant, well-appearing male in no distress HEENT: conjunctiva and sclera are clear, EOMI Neck: no lymphadenopathy or mass Heart: regular rate and rhythm Lungs: clear bilaterally Back: no CVA tenderness Abdomen: soft, nontender, no mass GU: circumcised penis.  No lesions.  Urethral opening is normal. Psych: normal mood, affect, hygiene and grooming Neuro: alert and oriented, cranial nerves grossly intact, normal gait.  Urine SG 1.010, +glu, 1+ leuks, negative nitrite, negative blood. This wasn't a mid-stream specimen, was initial.   ASSESSMENT/PLAN:  Pain with urination - urethral pain. UTI not suspected based on u/a in ER, or today. Sx improved today. Ddx reviewed in detail. Increase hydration. f/u if persists/worsens - Plan: POCT Urinalysis DIP (Proadvantage Device)  Stay well hydrated--drink more water and less caffeinated beverages.  Your urine doesn't appear to be infected (based on the  urine from last night and that your symptoms are improving). We discussed that a small/tiny bladder stone leaving the bladder could cause similar symptoms. Inflammation and infection of the urethra can cause these symptoms--you are not at risk for sexually transmitted diseases.  If you have recurrent or worsening pain with urination, trouble emptying your bladder, blood in the urine, fever, flank pain, then you will need to be re-evaluated.

## 2023-11-18 DIAGNOSIS — E119 Type 2 diabetes mellitus without complications: Secondary | ICD-10-CM | POA: Diagnosis not present

## 2023-11-18 DIAGNOSIS — H5203 Hypermetropia, bilateral: Secondary | ICD-10-CM | POA: Diagnosis not present

## 2023-11-18 LAB — OPHTHALMOLOGY REPORT-SCANNED

## 2023-11-20 ENCOUNTER — Telehealth: Payer: Self-pay | Admitting: Internal Medicine

## 2023-11-20 DIAGNOSIS — G5602 Carpal tunnel syndrome, left upper limb: Secondary | ICD-10-CM | POA: Diagnosis not present

## 2023-11-20 NOTE — Telephone Encounter (Signed)
 FYI  Copied from CRM [id number]. Topic: General - Other >> Nov 20, 2023  1:29 PM Debby BROCKS wrote: Reason for CRM: Patient wants to pass on information to Dr. Randol so she's aware. He saw a hand doctor and will be looking into getting hand surgery. He spoke with Dr. Germaine for carpal tunnel surgery.

## 2023-12-30 ENCOUNTER — Telehealth: Payer: Self-pay | Admitting: *Deleted

## 2023-12-30 NOTE — Telephone Encounter (Signed)
 I do not have access to the EMG/NCV/US  that was apparently done by [facility] in August.  I can see it was done, but not the result through [facility] Everywhere. That test is what would confirm whether he has carpal tunnel syndrome or not.   I can discuss further with him at his upcoming appointment, but he may want to try and get those records sent to us  for my review before then, so I'll have a better answer.

## 2023-12-30 NOTE — Telephone Encounter (Signed)
 Copied from CRM [id number]. Topic: Clinical - Medical Advice >> Dec 30, 2023  3:40 PM Johnathan Estrada wrote: Reason for CRM: Patient bitten by dog several times in May 23, 2023. After dog attack, upper joint on the ring finger was bent at 90 degrees. Dr. Sydelle recommended a brace for 6 weeks to help his hand straighten out. The brace did not remedy the problem.  Dr. Germaine at hand center dx him with carpel tunnel. The ring finger still has pain in the joint. His thumb, index, and ring finger are tingly to touch but not similar to when he had carpel tunnel in the right hand. Stiffness. He is scheduled for surgery 11/4 but he is concerned about having an unnecessary surgery since the symptoms are so different than when he had carpel tunnel in the right hand. Wants advice from Dr. Randol. CB# [phone].  He has appt 01/12/24

## 2023-12-31 NOTE — Telephone Encounter (Signed)
 Patient advised and he will get the records sent over.

## 2024-01-06 ENCOUNTER — Other Ambulatory Visit: Payer: Self-pay | Admitting: Family Medicine

## 2024-01-06 NOTE — Telephone Encounter (Signed)
 Patient does not need refill today-has enough to last until appt Monday 01/12/24.

## 2024-01-11 NOTE — Progress Notes (Unsigned)
 No chief complaint on file.  Patient presents for 6 month f/u on chronic problems. He is scheduled for carpal tunnel release next month.   DM--Last A1c was 6.0% in April, up from 5.6 in 12/2022.  He had gained some weight, was eating more potato chips, and hadn't walked as much for 6 weeks (related to the injury from dog attack).  Current exercise, diet ***  He is compliant with his 18 U of levemir /lantus .  ***DOSE Sugars are running ***  He denies polydipsia, polyuria or hypoglycemia. He checks his feet regularly, denies concerns. Last diabetic eye exam was in July, 2024, retinopathy was noted. ***     Hyperlipidemia:  Patient reports compliance with 10mg  rosuvastatin , and denies side effects. Lipids were controlled on this regimen on last check. Denies significant changes in his diet.   Lab Results  Component Value Date   CHOL 139 07/09/2023   HDL 35 (L) 07/09/2023   LDLCALC 78 07/09/2023   TRIG 148 07/09/2023   CHOLHDL 4.0 07/09/2023     Hypothyroidism:  He reports compliance in taking levothyroxine  125 mcg daily.  He had missed some doses prior to his last check, and TSH was slightly elevated. Dose was not changed. Due for recheck of TSH today. ***missed doses??  He takes this medication on an empty stomach, takes it only with the statin, no other medications or vitamins. No changes to hair/skin/nails/energy, moods.   Weight ***  Lab Results  Component Value Date   TSH 4.510 (H) 07/09/2023      Vitamin D  deficiency:  Last level was normal at 41.7 in April. He hadn't been taking MVI or vitamin D . Level had dropped from the prior value of 78.3 in 05/2022 when taking 5000 IU of D3 daily, in addition to MVI daily.  He was advised to get 2000 IU daily (1000 IU D3 plus MVI). He is currently taking ***   Component Ref Range & Units (hover) 6 mo ago (07/09/23) 1 yr ago (06/20/22) 3 yr ago (08/28/20) 8 yr ago (06/14/15) 10 yr ago (01/13/14) 10 yr ago (01/25/13) 11 yr  ago (07/08/12)  Vit D, 25-Hydroxy 41.7 78.3 CM 34 R 10 Low  R, CM 25 Low  R, CM 32 R, CM 25 Low  R, CM        PMH, PSH, SH reviewed   ROS:    PHYSICAL EXAM:  There were no vitals taken for this visit.  Wt Readings from Last 3 Encounters:  10/30/23 197 lb (89.4 kg)  10/29/23 190 lb (86.2 kg)  07/16/23 196 lb (88.9 kg)       ASSESSMENT/PLAN:  Flu shot Should need RF levemir  Did he get diabetic eye exam this year?  Last we have in chart was 09/2022.  If he had, please request records If not, remind pt to schedule  Ensure no missed doses of thyroid . Is he taking MVI and separate D3 1000 IU daily?  ***RF THYROID  AFTER LABS BACK

## 2024-01-12 ENCOUNTER — Encounter: Payer: Self-pay | Admitting: Family Medicine

## 2024-01-12 ENCOUNTER — Ambulatory Visit: Admitting: Family Medicine

## 2024-01-12 VITALS — BP 124/78 | HR 64 | Ht 70.0 in | Wt 199.4 lb

## 2024-01-12 DIAGNOSIS — E1169 Type 2 diabetes mellitus with other specified complication: Secondary | ICD-10-CM | POA: Diagnosis not present

## 2024-01-12 DIAGNOSIS — Z794 Long term (current) use of insulin: Secondary | ICD-10-CM | POA: Diagnosis not present

## 2024-01-12 DIAGNOSIS — Z5181 Encounter for therapeutic drug level monitoring: Secondary | ICD-10-CM | POA: Diagnosis not present

## 2024-01-12 DIAGNOSIS — E039 Hypothyroidism, unspecified: Secondary | ICD-10-CM | POA: Diagnosis not present

## 2024-01-12 DIAGNOSIS — F341 Dysthymic disorder: Secondary | ICD-10-CM | POA: Diagnosis not present

## 2024-01-12 DIAGNOSIS — E782 Mixed hyperlipidemia: Secondary | ICD-10-CM

## 2024-01-12 DIAGNOSIS — G5602 Carpal tunnel syndrome, left upper limb: Secondary | ICD-10-CM

## 2024-01-12 DIAGNOSIS — Z23 Encounter for immunization: Secondary | ICD-10-CM | POA: Diagnosis not present

## 2024-01-12 DIAGNOSIS — M20012 Mallet finger of left finger(s): Secondary | ICD-10-CM | POA: Diagnosis not present

## 2024-01-12 DIAGNOSIS — E559 Vitamin D deficiency, unspecified: Secondary | ICD-10-CM

## 2024-01-12 LAB — POCT GLYCOSYLATED HEMOGLOBIN (HGB A1C): Hemoglobin A1C: 6.1 % — AB (ref 4.0–5.6)

## 2024-01-12 MED ORDER — LANTUS SOLOSTAR 100 UNIT/ML ~~LOC~~ SOPN
PEN_INJECTOR | SUBCUTANEOUS | 1 refills | Status: DC
Start: 2024-01-12 — End: 2024-01-19

## 2024-01-12 NOTE — Patient Instructions (Signed)
 Read your labels when shopping, limiting the sodium, saturated fats, added sugars, looking at the carbs.  Please try and add structure to you day, this might help with motivation. Put things on your calendar (going to the gym, volunteering, etc). Consider counseling. (We discussed [facility] and [facility]).

## 2024-01-13 ENCOUNTER — Ambulatory Visit: Payer: Self-pay | Admitting: Family Medicine

## 2024-01-13 LAB — TSH: TSH: 1.71 u[IU]/mL (ref 0.450–4.500)

## 2024-01-13 MED ORDER — LEVOTHYROXINE SODIUM 125 MCG PO TABS: 125.0000 ug | ORAL_TABLET | Freq: Every day | ORAL | 1 refills | Status: AC

## 2024-01-14 ENCOUNTER — Encounter: Payer: Self-pay | Admitting: Family Medicine

## 2024-01-19 ENCOUNTER — Telehealth: Payer: Self-pay | Admitting: Family Medicine

## 2024-01-19 DIAGNOSIS — E1169 Type 2 diabetes mellitus with other specified complication: Secondary | ICD-10-CM

## 2024-01-19 MED ORDER — LANTUS SOLOSTAR 100 UNIT/ML ~~LOC~~ SOPN
PEN_INJECTOR | SUBCUTANEOUS | 1 refills | Status: AC
Start: 2024-01-19 — End: ?

## 2024-01-19 NOTE — Addendum Note (Signed)
 Addended by: VIVIAN COUNTRYMAN F on: 01/19/2024 05:09 PM   Modules accepted: Orders

## 2024-01-19 NOTE — Telephone Encounter (Signed)
 Sent!

## 2024-01-19 NOTE — Telephone Encounter (Signed)
 Copied from CRM [id number]. Topic: Clinical - Prescription Issue >> Jan 19, 2024  2:57 PM Wess RAMAN wrote: Reason for CRM: Patient needs insulin  glargine (LANTUS  SOLOSTAR) 100 UNIT/ML Solostar Pen sent to [facility], due to [place] not able to open boxes and distribute pens. Patient will need to purchase the entire box   Callback #: [id number]  Pharmacy: [id number] [place], [place] - [address] AT [facility] [address] [facility] [address] [place] [address] Phone: [phone] Fax: [phone] Hours: Not open 24 hours

## 2024-02-06 ENCOUNTER — Telehealth: Payer: Self-pay | Admitting: Family Medicine

## 2024-02-06 NOTE — Telephone Encounter (Unsigned)
 Copied from CRM #[id number]. Topic: Referral - Request for Referral >> Feb 06, 2024  2:29 PM Donee H wrote: Did the patient discuss referral with their provider in the last year? Yes   Appointment offered? No  Type of order/referral and detailed reason for visit: Orthopedic for hand  Patient states need surgery on hand   Preference of office, provider, location:   [facility] Second Floor, [address], [place], [place] [address] Phone: [phone]  Either DOROTHA Robynn Rias or Elsie Mussel III  If referral order, have you been seen by this specialty before? No   Can we respond through MyChart? Yes

## 2024-02-08 NOTE — Telephone Encounter (Signed)
 [facility] (Dr. Germaine) and had his eval/work-up through their office. Guessing he is seeking second opinion?  Okay for referral as requested.

## 2024-02-09 ENCOUNTER — Other Ambulatory Visit: Payer: Self-pay | Admitting: *Deleted

## 2024-02-09 DIAGNOSIS — G8929 Other chronic pain: Secondary | ICD-10-CM

## 2024-02-09 DIAGNOSIS — M79645 Pain in left finger(s): Secondary | ICD-10-CM | POA: Diagnosis not present

## 2024-02-09 DIAGNOSIS — M79642 Pain in left hand: Secondary | ICD-10-CM

## 2024-02-09 NOTE — Telephone Encounter (Signed)
 Done

## 2024-02-16 DIAGNOSIS — S66822A Laceration of other specified muscles, fascia and tendons at wrist and hand level, left hand, initial encounter: Secondary | ICD-10-CM | POA: Diagnosis not present

## 2024-02-16 DIAGNOSIS — G5602 Carpal tunnel syndrome, left upper limb: Secondary | ICD-10-CM | POA: Diagnosis not present

## 2024-02-16 DIAGNOSIS — S61412A Laceration without foreign body of left hand, initial encounter: Secondary | ICD-10-CM | POA: Diagnosis not present

## 2024-03-08 ENCOUNTER — Telehealth: Payer: Self-pay | Admitting: Internal Medicine

## 2024-03-08 MED ORDER — ACCU-CHEK GUIDE TEST VI STRP: ORAL_STRIP | 2 refills | Status: AC

## 2024-03-08 MED ORDER — ACCU-CHEK SOFTCLIX LANCETS MISC: 12 refills | Status: AC

## 2024-03-08 NOTE — Telephone Encounter (Signed)
 Sent in new rx for them  Copied from CRM #[id number]. Topic: Clinical - Prescription Issue >> Mar 08, 2024  8:44 AM Wess RAMAN wrote: Reason for CRM: Pharmacy stated the insurance would like specific directions for glucose blood (ACCU-CHEK GUIDE TEST) test strip and lancets.  Pharmacy: [id number] - [place] - [address] [place] [address] Phone: [phone] Fax: [phone] Hours: Not open 24 hours

## 2024-07-22 ENCOUNTER — Encounter: Payer: Self-pay | Admitting: Family Medicine
